# Patient Record
Sex: Female | Born: 1995
Health system: Southern US, Community
[De-identification: ages and names within clinical notes are randomized; demographics above are authoritative.]

## PROBLEM LIST (undated history)

## (undated) DIAGNOSIS — L309 Dermatitis, unspecified: Secondary | ICD-10-CM

## (undated) HISTORY — DX: Dermatitis, unspecified: L30.9

## (undated) HISTORY — PX: NO PAST SURGERIES: SHX2092

---

## 2016-04-14 DIAGNOSIS — J3089 Other allergic rhinitis: Secondary | ICD-10-CM | POA: Insufficient documentation

## 2016-04-14 DIAGNOSIS — J302 Other seasonal allergic rhinitis: Secondary | ICD-10-CM

## 2016-04-14 DIAGNOSIS — H1010 Acute atopic conjunctivitis, unspecified eye: Secondary | ICD-10-CM | POA: Insufficient documentation

## 2016-04-14 HISTORY — DX: Other seasonal allergic rhinitis: J30.2

## 2017-02-01 ENCOUNTER — Ambulatory Visit (INDEPENDENT_AMBULATORY_CARE_PROVIDER_SITE_OTHER): Payer: 59 | Admitting: Family Medicine

## 2017-02-01 ENCOUNTER — Telehealth: Payer: Self-pay | Admitting: Behavioral Health

## 2017-02-01 ENCOUNTER — Encounter: Payer: Self-pay | Admitting: Family Medicine

## 2017-02-01 ENCOUNTER — Encounter: Payer: Self-pay | Admitting: Behavioral Health

## 2017-02-01 VITALS — BP 92/72 | HR 88 | Temp 98.6°F | Ht 63.0 in | Wt 103.6 lb

## 2017-02-01 DIAGNOSIS — R079 Chest pain, unspecified: Secondary | ICD-10-CM

## 2017-02-01 DIAGNOSIS — L2082 Flexural eczema: Secondary | ICD-10-CM | POA: Diagnosis not present

## 2017-02-01 DIAGNOSIS — G8929 Other chronic pain: Secondary | ICD-10-CM

## 2017-02-01 DIAGNOSIS — M25512 Pain in left shoulder: Secondary | ICD-10-CM | POA: Diagnosis not present

## 2017-02-01 HISTORY — DX: Flexural eczema: L20.82

## 2017-02-01 MED ORDER — TRIAMCINOLONE ACETONIDE 0.1 % EX CREA: 1.0000 "application " | TOPICAL_CREAM | Freq: Two times a day (BID) | CUTANEOUS | 3 refills | Status: DC

## 2017-02-01 MED FILL — TRIAMCINOLONE 0.1% CREAM: 0.1 | 10 days supply | Qty: 30 | Fill #0

## 2017-02-01 NOTE — Progress Notes (Signed)
[facility] [address], [address] [place], [place] [address] [phone] [phone]  Date:  02/01/2017   Name:  Jessica Potter   DOB:  June 22, 1996   MRN:  [id number]  PCP:  Pearline Cablesopland, Jessica C, MD    Chief Complaint: Establish Care (Pt here to est care. )   History of Present Illness:  Jessica Potter is a 21 y.o. very pleasant female patient who presents with the following:  Here today as a new patient- she is aging out of pediatrics.   She works at [employer].  She has generally been healthy She enjoys playing baseketball and gets to play about once a week She lives with her mom and GM She is from this area.    She graduated from [employer] Never had any surgery She does not any medication NKDA  She did have some chest pains "a month or two ago," would occur only at night.  Would occur when she was getting ready for bed. Might last 5 minutes, non exertional.   Occurred maybe 3 times. Has not happened in several weeks No issues with exercise- associated symptoms such as CP, SOB or syncope  She is a non smoker Does not drink alcohol She is not sexually active- she has not yet started having sex  She does not have any heart problems as far as we know  [facility],  Montey HoraKathleen Rice.   She does have eczema on her arms- needs some cream to use for this - she does not have anything on hand at this time   She has also noted chronic pain in her left trapezius- this has been present for about 3 years.  It hurts more when she raises her left arm NKI She is right handed   Patient Active Problem List   Diagnosis Date Noted  . Flexural eczema 02/01/2017    Past Medical History:  Diagnosis Date  . Eczema     Past Surgical History:  Procedure Laterality Date  . NO PAST SURGERIES      Social History  Substance Use Topics  . Smoking status: Never Smoker  . Smokeless tobacco: Never Used  . Alcohol use No     Family History  Problem Relation Age of Onset  . Diabetes Maternal Grandmother     No Known Allergies  Medication list has been reviewed and updated.  No current outpatient prescriptions on file prior to visit.   No current facility-administered medications on file prior to visit.     Review of Systems:  As per HPI- otherwise negative.   Physical Examination: Vitals:   02/01/17 1320  BP: 92/72  Pulse: 88  Temp: 98.6 F (37 C)   Vitals:   02/01/17 1320  Weight: 103 lb 9.6 oz (47 kg)  Height: 5\' 3"  (1.6 m)   Body mass index is 18.35 kg/m. Ideal Body Weight: Weight in (lb) to have BMI = 25: 140.8  GEN: WDWN, NAD, Non-toxic, A & O x 3, looks well, petite build, appears younger than age HEENT: Atraumatic, Normocephalic. Neck supple. No masses, No LAD.  Bilateral TM wnl, oropharynx normal.  PEERL,EOMI.   Ears and Nose: No external deformity. CV: RRR, No M/G/R. No JVD. No thrill. No extra heart sounds. PULM: CTA B, no wheezes, crackles, rhonchi. No retractions. No resp. distress. No accessory muscle use. ABD: S, NT, ND. No rebound. No HSM. EXTR: No c/c/e NEURO Normal gait.  PSYCH: Normally interactive. Conversant. Not depressed or anxious appearing.  Calm demeanor.  Upon palpation her superior LEFT trapezius has a palpable area of spasm or fibrous tissue that is tender to palpation.  Normal ROM of the shoulder, normal stregth Mild eczema visible on both arms    Assessment and Plan: Flexural eczema - Plan: triamcinolone cream (KENALOG) 0.1 %  Chronic left shoulder pain - Plan: Ambulatory referral to Sports Medicine  Chest pain, unspecified type  Here today as a new patient to establish care Generally healthy young woman Triamcinolone cream to use as needed for eczema.  Discussed methods to avoid skin dryness Referral to sports med for her shoulder concern Discussed further evaluation of her chest pain- offered EKG, CXR, echo.  For now she prefers to observe  but will seek care if this comes back    Signed Abbe Amsterdam, MD

## 2017-02-01 NOTE — Patient Instructions (Signed)
It was very nice to see you today!  Have a wonderful summer and let me know if your chest pains come back.  I am going to have you see Dr. Pearletha ForgeHudnall (sports medicine) regarding your persistent left shoulder pain We will get your records from [facility] for you

## 2017-02-01 NOTE — Telephone Encounter (Signed)
Pre-Visit Call completed with patient and chart updated.   Pre-Visit Info documented in Specialty Comments under SnapShot.    

## 2017-02-04 ENCOUNTER — Encounter: Payer: Self-pay | Admitting: Family Medicine

## 2017-02-04 ENCOUNTER — Ambulatory Visit (INDEPENDENT_AMBULATORY_CARE_PROVIDER_SITE_OTHER): Payer: 59 | Admitting: Family Medicine

## 2017-02-04 DIAGNOSIS — S46812A Strain of other muscles, fascia and tendons at shoulder and upper arm level, left arm, initial encounter: Secondary | ICD-10-CM | POA: Diagnosis not present

## 2017-02-04 NOTE — Patient Instructions (Signed)
You have a trapezius strain/spasms. Medicine is unlikely to fix this. You can take tylenol 500mg  1-2 tabs three times a day as needed for pain. Aleve 1-2 tabs twice a day with food as needed for pain also. Exercises and stretches are the most important part of treatment. Hold stretches for 20-30 seconds, repeat 3 times. Any strengthening exercises should be done 3 sets of 10 once a day. Call me if you want to do physical therapy. Follow up with me in 6 weeks or as needed.

## 2017-02-09 DIAGNOSIS — S46812A Strain of other muscles, fascia and tendons at shoulder and upper arm level, left arm, initial encounter: Secondary | ICD-10-CM | POA: Insufficient documentation

## 2017-02-09 NOTE — Assessment & Plan Note (Signed)
she declined physical therapy for now - advised this is most likely thing to help with this.  She was given home exercises and stretches to do daily.  Aleve twice a day, tylenol up to 3 times a day as needed for pain.  Call us if she would like to do PT.  F/u in 6 weeks or prn.

## 2017-02-09 NOTE — Progress Notes (Signed)
PCP and consultation requested by: Copland, [name], MD  Subjective:   HPI: Patient is a 21 y.o. female here for left shoulder pain.  Patient reports for about 3 years she's had pain posterior left shoulder. Pain is 0/10 at rest, up to 7-8/10 and sharp at times. No swelling or bruising. repetitive overhead motions bother this area and turning neck. Tried icy hot. Has not done PT or massage. Right handed. No skin changes, numbness. No radiation of pain.  Past Medical History:  Diagnosis Date  . Eczema     Current Outpatient Prescriptions on File Prior to Visit  Medication Sig Dispense Refill  . triamcinolone cream (KENALOG) 0.1 % Apply 1 application topically 2 (two) times daily. Use as needed for eczema 120 g 3   No current facility-administered medications on file prior to visit.     Past Surgical History:  Procedure Laterality Date  . NO PAST SURGERIES      No Known Allergies  Social History   Social History  . Marital status: Single    Spouse name: N/A  . Number of children: N/A  . Years of education: N/A   Occupational History  . Not on file.   Social History Main Topics  . Smoking status: Never Smoker  . Smokeless tobacco: Never Used  . Alcohol use No  . Drug use: No  . Sexual activity: Not on file   Other Topics Concern  . Not on file   Social History Narrative  . No narrative on file    Family History  Problem Relation Age of Onset  . Diabetes Maternal Grandmother     BP 111/73   Pulse 86   Ht 5\' 3"  (1.6 m)   Wt 103 lb (46.7 kg)   LMP 01/19/2017 (LMP Unknown)   BMI 18.25 kg/m   Review of Systems: See HPI above.     Objective:  Physical Exam:  Gen: NAD, comfortable in exam room  Neck: No gross deformity, swelling, bruising. TTP left trapezius.  No midline/bony TTP. FROM neck - pain left lateral rotation. BUE strength 5/5.   Sensation intact to light touch.   2+ equal reflexes in triceps, biceps, brachioradialis  tendons. Negative spurlings. NV intact distal BUEs.  Left shoulder: No swelling, ecchymoses.  No gross deformity. No TTP. FROM. Negative Hawkins, Neers. Negative Yergasons. Strength 5/5 with empty can and resisted internal/external rotation. Negative apprehension. NV intact distally.   Assessment & Plan:  1. Left trapezius strain/spasms - she declined physical therapy for now - advised this is most likely thing to help with this.  She was given home exercises and stretches to do daily.  Aleve twice a day, tylenol up to 3 times a day as needed for pain.  Call us if she would like to do PT.  F/u in 6 weeks or prn.

## 2017-02-23 ENCOUNTER — Telehealth: Payer: Self-pay | Admitting: *Deleted

## 2017-02-23 NOTE — Telephone Encounter (Signed)
Received Medical records from [facility], Montey HoraKathleen Rice, MD, forwarded to provider/SLS 06/12

## 2017-02-24 ENCOUNTER — Telehealth: Payer: Self-pay | Admitting: Family Medicine

## 2017-02-24 NOTE — Telephone Encounter (Signed)
[facility], Dr. Montey HoraKathleen Rice.  Will abstract/ scan for her

## 2017-03-01 MED FILL — TRIAMCINOLONE 0.1% CREAM: 0.1 | 10 days supply | Qty: 30 | Fill #1

## 2017-03-15 MED FILL — TRIAMCINOLONE 0.1% CREAM: 0.1 | 10 days supply | Qty: 30 | Fill #2

## 2017-07-08 MED FILL — TRIAMCINOLONE 0.1% CREAM: 0.1 | 10 days supply | Qty: 30 | Fill #3

## 2017-09-17 MED FILL — TRIAMCINOLONE 0.1% CREAM: 0.1 | 10 days supply | Qty: 30 | Fill #4

## 2017-10-06 NOTE — Progress Notes (Signed)
[facility] [address], [address] [place], [place] [address] [phone] [phone]  Date:  10/07/2017   Name:  Jessica HopeBreanna Panther   DOB:  [birth year]   MRN:  [id number]  PCP:  Pearline Cablesopland, Jessica C, MD    Chief Complaint: Headache (c/o headaches x 1-2 weeks)   History of Present Illness:  Jessica Potter is a 22 y.o. very pleasant female patient who presents with the following:  Generally healthy young woman here today with concern of headaches and nosebleeds for 3 days- she states NOT  1-2 weeks as originally documented The headches have now resolved. The nosebleeds have also stopped She feels "good" now  She was sick with a possible sinus infection prior to onset of these sx with headache, nasal congestion, cough She notes brief, easy to control nosebleeds from mostly the left side  She also notes spots on her tongue when she eats pineapple or kiwi fruit.  Otherwise this does not happen Her ?grandmother also prompts her to mention that her "back always feels like it needs to be cracked," and that "she is always cracking all her joints."    Patient Active Problem List   Diagnosis Date Noted  . Trapezius muscle strain, left, initial encounter 02/09/2017  . Flexural eczema 02/01/2017  . Seasonal allergic rhinitis 04/14/2016    Past Medical History:  Diagnosis Date  . Eczema     Past Surgical History:  Procedure Laterality Date  . NO PAST SURGERIES      Social History   Tobacco Use  . Smoking status: Never Smoker  . Smokeless tobacco: Never Used  Substance Use Topics  . Alcohol use: No  . Drug use: No    Family History  Problem Relation Age of Onset  . Diabetes Maternal Grandmother     No Known Allergies  Medication list has been reviewed and updated.  Current Outpatient Medications on File Prior to Visit  Medication Sig Dispense Refill  . triamcinolone cream (KENALOG) 0.1 % Apply 1 application topically 2 (two) times  daily. Use as needed for eczema 120 g 3   No current facility-administered medications on file prior to visit.     Review of Systems:  As per HPI- otherwise negative. No fever, chills, rash, ST or cough Pt is currently feeling fine   Pulse Readings from Last 3 Encounters:  10/07/17 (!) 101  02/04/17 86  02/01/17 88  '   Physical Examination: Vitals:   10/07/17 1810  BP: 106/70  Pulse: (!) 101  Temp: 98.7 F (37.1 C)  SpO2: 99%   Vitals:   10/07/17 1810  Weight: 107 lb 9.6 oz (48.8 kg)  Height: 5\' 3"  (1.6 m)   Body mass index is 19.06 kg/m. Ideal Body Weight: Weight in (lb) to have BMI = 25: 140.8  GEN: WDWN, NAD, Non-toxic, A & O x 3, appears well, slim build HEENT: Atraumatic, Normocephalic. Neck supple. No masses, No LAD.  Bilateral TM wnl, oropharynx normal.  PEERL,EOMI.  No evidence of bleeding from either nare Ears and Nose: No external deformity. CV: RRR, No M/G/R. No JVD. No thrill. No extra heart sounds. PULM: CTA B, no wheezes, crackles, rhonchi. No retractions. No resp. distress. No accessory muscle use. ABD: S, NT, ND. No rebound. No HSM. EXTR: No c/c/e NEURO Normal gait.  PSYCH: Normally interactive. Conversant. Not depressed or anxious appearing.  Calm  demeanor.  Minimal thoracic scoliosis on exam,  Normal spine ROM  Assessment and Plan: Frequent nosebleeds  Frequent headaches  Tongue coating  Thoracic back pain, unspecified back pain laterality, unspecified chronicity  Here today with several concerns.   She notes that she was recently ill, and then had nosebleeds and headaches for a few days. These sx are now totally resolved and she feels fine.  Asked her to let me know if these sx return She also notes a tongue reaction when she eats pineapple or kiwi- advised that this is likely a low level allergy, related to latex allergy,  If this gets worse she should avoid these fruits Pt states that she often feels like she needs to crack her  back.  Reassured that her exam is benign and this is likely just a personal trait    Signed Abbe Amsterdam, MD

## 2017-10-07 ENCOUNTER — Ambulatory Visit (INDEPENDENT_AMBULATORY_CARE_PROVIDER_SITE_OTHER): Payer: Self-pay | Admitting: Family Medicine

## 2017-10-07 ENCOUNTER — Encounter: Payer: Self-pay | Admitting: Family Medicine

## 2017-10-07 VITALS — BP 106/70 | HR 101 | Temp 98.7°F | Ht 63.0 in | Wt 107.6 lb

## 2017-10-07 DIAGNOSIS — M546 Pain in thoracic spine: Secondary | ICD-10-CM

## 2017-10-07 DIAGNOSIS — R040 Epistaxis: Secondary | ICD-10-CM

## 2017-10-07 DIAGNOSIS — R51 Headache: Secondary | ICD-10-CM

## 2017-10-07 DIAGNOSIS — R519 Headache, unspecified: Secondary | ICD-10-CM

## 2017-10-07 DIAGNOSIS — K143 Hypertrophy of tongue papillae: Secondary | ICD-10-CM

## 2017-10-07 NOTE — Patient Instructions (Signed)
Take care, let me know if you have any return of your nosebleeds or headaches

## 2017-12-14 NOTE — Progress Notes (Addendum)
[facility] [address] [place], [place] [address] [phone]  Date:  12/15/2017   Name:  Jessica Potter   DOB:  1995/12/13   MRN:  [id number]  PCP:  Jessica Cables, MD    Chief Complaint: URI (cough, sore throat, headache, 1 week-no fever) and Conjunctivitis   History of Present Illness:  Jessica Potter is a 21 y.o. very pleasant female patient who presents with the following:  Generally healthy young woman here today with concern of illness for about one week Her throat has been sore, feels scratchy She notes discharge from her left eye only- right eye is ok No fever She has been coughing some but this is not a main feature She did have aches for about 2 days, now better No GI symptoms Her eye can be crusty in the am She does not wear any corrective lenses Her vision seems to be ok  They eye is itchy but not painful No sick contacts at home  She has tried some advil OTC  Patient Active Problem List   Diagnosis Date Noted  . Trapezius muscle strain, left, initial encounter 02/09/2017  . Flexural eczema 02/01/2017  . Seasonal allergic rhinitis 04/14/2016    Past Medical History:  Diagnosis Date  . Eczema     Past Surgical History:  Procedure Laterality Date  . NO PAST SURGERIES      Social History   Tobacco Use  . Smoking status: Never Smoker  . Smokeless tobacco: Never Used  Substance Use Topics  . Alcohol use: No  . Drug use: No    Family History  Problem Relation Age of Onset  . Diabetes Maternal Grandmother     No Known Allergies  Medication list has been reviewed and updated.  No current outpatient medications on file prior to visit.   No current facility-administered medications on file prior to visit.     Review of Systems:  As per HPI- otherwise negative.   Physical Examination: Vitals:   12/15/17 1511  BP: 108/72  Pulse: (!) 134  Resp: 16  Temp: 98.1 F  (36.7 C)  SpO2: 100%   Vitals:   12/15/17 1511  Weight: 111 lb (50.3 kg)  Height: 5\' 3"  (1.6 m)   Body mass index is 19.66 kg/m. Ideal Body Weight: Weight in (lb) to have BMI = 25: 140.8  GEN: WDWN, NAD, Non-toxic, A & O x 3. Slight build, looks well except for injection of left eye  HEENT: Atraumatic, Normocephalic. Neck supple. No masses, No LAD. Bilateral TM wnl, oropharynx normal.  PEERL,EOMI.   Left conjunctivae is injected c/w conjunctivitis Limited fundoscopic exam wnl Ears and Nose: No external deformity. CV: RRR, No M/G/R. No JVD. No thrill. No extra heart sounds. PULM: CTA B, no wheezes, crackles, rhonchi. No retractions. No resp. distress. No accessory muscle use. ABD: S, NT, ND. No rebound. No HSM. EXTR: No c/c/e NEURO Normal gait.  PSYCH: Normally interactive. Conversant. Not depressed or anxious appearing.  Calm demeanor.   Results for orders placed or performed in visit on 12/15/17  POCT rapid strep A  Result Value Ref Range   Rapid Strep A Screen Negative Negative     Assessment and Plan: Sore throat - Plan: POCT rapid strep A, Culture, Group A Strep  Acute conjunctivitis of left eye, unspecified acute conjunctivitis type - Plan: trimethoprim-polymyxin b (POLYTRIM) ophthalmic solution  Rapid strep negative- will send culture but  suspect this may be adenovirus.  Will cover for conjunctivitis with polytrim.  Await culture and will contact her.   Discussed supportive care Note for her job   Signed Abbe AmsterdamJessica Bulmaro Feagans, MD  Pulse Readings from Last 3 Encounters:  12/15/17 (!) 134  10/07/17 (!) 101  02/04/17 86    BP Readings from Last 3 Encounters:  12/15/17 108/72  10/07/17 106/70  02/04/17 111/73    Received throat culture 4/5- also negative  Results for orders placed or performed in visit on 12/15/17  Culture, Group A Strep  Result Value Ref Range   MICRO NUMBER: [id number]    SPECIMEN QUALITY: ADEQUATE    SOURCE: THROAT    STATUS: FINAL     RESULT: No group A Streptococcus isolated   POCT rapid strep A  Result Value Ref Range   Rapid Strep A Screen Negative Negative   Message to pt

## 2017-12-15 ENCOUNTER — Encounter: Payer: Self-pay | Admitting: Family Medicine

## 2017-12-15 ENCOUNTER — Ambulatory Visit: Payer: BLUE CROSS/BLUE SHIELD | Admitting: Family Medicine

## 2017-12-15 VITALS — BP 108/72 | HR 102 | Temp 98.1°F | Resp 16 | Ht 63.0 in | Wt 111.0 lb

## 2017-12-15 DIAGNOSIS — H1032 Unspecified acute conjunctivitis, left eye: Secondary | ICD-10-CM

## 2017-12-15 DIAGNOSIS — J029 Acute pharyngitis, unspecified: Secondary | ICD-10-CM | POA: Diagnosis not present

## 2017-12-15 LAB — POCT RAPID STREP A (OFFICE): Rapid Strep A Screen: NEGATIVE

## 2017-12-15 MED ORDER — POLYMYXIN B-TRIMETHOPRIM 10000-0.1 UNIT/ML-% OP SOLN: 1.0000 [drp] | OPHTHALMIC | 0 refills | Status: DC

## 2017-12-15 MED FILL — POLYMYXIN B/TMP EYE DROPS: 10000-0.1 | 42 days supply | Qty: 10 | Fill #0

## 2017-12-15 NOTE — Patient Instructions (Addendum)
Good to see you today- I am sorry that you are not feeling well! Your rapid strep was negative, but we will send off a culture to make sure that you do not have strep. I suspect you may have a viral infection called adenovirus. However, we will use an eye medication for you in case you have a bacterial infection of your eye   Rest, fluids, tylenol and/or advil can be helpful  Please let me know if you are not much better in the next 2-3 days- Sooner if worse.

## 2017-12-17 ENCOUNTER — Encounter: Payer: Self-pay | Admitting: Family Medicine

## 2017-12-17 LAB — CULTURE, GROUP A STREP
MICRO NUMBER: 90412049
SPECIMEN QUALITY:: ADEQUATE

## 2018-01-04 ENCOUNTER — Other Ambulatory Visit: Payer: Self-pay | Admitting: Family Medicine

## 2018-01-04 DIAGNOSIS — H1032 Unspecified acute conjunctivitis, left eye: Secondary | ICD-10-CM

## 2018-01-05 ENCOUNTER — Encounter: Payer: Self-pay | Admitting: Family Medicine

## 2018-01-05 MED FILL — TRIAMCINOLONE 0.1% CREAM: 0.1 | 10 days supply | Qty: 30 | Fill #5

## 2018-01-05 NOTE — Telephone Encounter (Signed)
Received refill request for Polytrim eye drops. Please advise.

## 2018-09-14 DIAGNOSIS — R87629 Unspecified abnormal cytological findings in specimens from vagina: Secondary | ICD-10-CM | POA: Insufficient documentation

## 2018-09-14 HISTORY — DX: Unspecified abnormal cytological findings in specimens from vagina: R87.629

## 2018-11-05 NOTE — Progress Notes (Addendum)
[facility] [address], [address] [place], [place] [address] [phone]  Date:  11/10/2018   Name:  Jessica Potter   DOB:  05/02/1996   MRN:  [id number]  PCP:  Pearline Cables, MD    Chief Complaint: Annual Exam (declines flu shot)   History of Present Illness:  Jessica Potter is a 23 y.o. very pleasant female patient who presents with the following:  Generally healthy young woman here today for physical She is originally from this area Working at at [employer] and [employer]  She is doing a lot of hours In her free time she enjoys resting  Never a smoker  Her LMP ended 2 days ago Last had intercourse last week - prior to her menses She is interested in contraception Discussed options with her- pill, patch, ring, depo, iud She would like to use depo after discussion of options No history of DVT/ pe/ stroke/ cancer  Pap:do today, will be her first  Labs:will do for her today .  She declines HIV Immunizations: Question flu shot- she declines   Patient Active Problem List   Diagnosis Date Noted  . Trapezius muscle strain, left, initial encounter 02/09/2017  . Flexural eczema 02/01/2017  . Seasonal allergic rhinitis 04/14/2016    Past Medical History:  Diagnosis Date  . Eczema     Past Surgical History:  Procedure Laterality Date  . NO PAST SURGERIES      Social History   Tobacco Use  . Smoking status: Never Smoker  . Smokeless tobacco: Never Used  Substance Use Topics  . Alcohol use: No  . Drug use: No    Family History  Problem Relation Age of Onset  . Diabetes Maternal Grandmother     No Known Allergies  Medication list has been reviewed and updated.  No current outpatient medications on file prior to visit.   No current facility-administered medications on file prior to visit.     Review of Systems:  As per HPI- otherwise negative. No fever or chills No CP or SOB  No breast  changes or concerns  Physical Examination: Vitals:   11/10/18 1501 11/10/18 1557  BP: 122/80   Pulse: (!) 112 85  Resp: 16   Temp: 98.4 F (36.9 C)   SpO2: 100%    Vitals:   11/10/18 1501  Weight: 113 lb (51.3 kg)  Height: 5\' 3"  (1.6 m)   Body mass index is 20.02 kg/m. Ideal Body Weight: Weight in (lb) to have BMI = 25: 140.8  GEN: WDWN, NAD, Non-toxic, A & O x 3, petite build, looks well  HEENT: Atraumatic, Normocephalic. Neck supple. No masses, No LAD. Ears and Nose: No external deformity. CV: RRR, No M/G/R. No JVD. No thrill. No extra heart sounds. PULM: CTA B, no wheezes, crackles, rhonchi. No retractions. No resp. distress. No accessory muscle use. ABD: S, NT, ND. No rebound. No HSM. EXTR: No c/c/e NEURO Normal gait.  PSYCH: Normally interactive. Conversant. Not depressed or anxious appearing.  Calm demeanor.  Breast: normal exam, no masses/ dimpling/ discharge Pelvic: normal, no vaginal lesions or discharge. Uterus normal, no CMT, no adnexal tendereness or masses  Waist 25in Assessment and Plan: Physical exam  Contraceptive use education - Plan: POCT urine pregnancy, medroxyPROGESTERone (DEPO-PROVERA) injection 150 mg, DISCONTINUED: medroxyPROGESTERone (DEPO-PROVERA) injection 150 mg  Screening for cervical cancer - Plan: Cytology - PAP  Screening for diabetes mellitus - Plan: Comprehensive metabolic panel,  Hemoglobin A1c  Screening for deficiency anemia - Plan: CBC  Screening for hyperlipidemia - Plan: Lipid panel  Flexural eczema - Plan: triamcinolone cream (KENALOG) 0.1 %  Hyperkalemia - Plan: Potassium  Here today for a CPE Start on depo shot today- went over perpetual calendar and how to use this medication Advised her to use condoms for the first 2 weeks Labs pending as above-  Pt has a form for her insurance that will need to be filled out once her labs come in   Signed Abbe Amsterdam, MD  Results for orders placed or performed in visit on  11/10/18  CBC  Result Value Ref Range   WBC 7.1 4.0 - 10.5 K/uL   RBC 4.61 3.87 - 5.11 Mil/uL   Platelets 293.0 150.0 - 400.0 K/uL   Hemoglobin 13.7 12.0 - 15.0 g/dL   HCT 16.1 09.6 - 04.5 %   MCV 92.1 78.0 - 100.0 fl   MCHC 32.3 30.0 - 36.0 g/dL   RDW 40.9 81.1 - 91.4 %  Comprehensive metabolic panel  Result Value Ref Range   Sodium 140 135 - 145 mEq/L   Potassium 5.3 (H) 3.5 - 5.1 mEq/L   Chloride 105 96 - 112 mEq/L   CO2 27 19 - 32 mEq/L   Glucose, Bld 88 70 - 99 mg/dL   BUN 15 6 - 23 mg/dL   Creatinine, Ser 7.82 0.40 - 1.20 mg/dL   Total Bilirubin 0.3 0.2 - 1.2 mg/dL   Alkaline Phosphatase 68 39 - 117 U/L   AST 18 0 - 37 U/L   ALT 13 0 - 35 U/L   Total Protein 7.7 6.0 - 8.3 g/dL   Albumin 4.7 3.5 - 5.2 g/dL   Calcium 95.6 8.4 - 21.3 mg/dL   GFR 086.57 >84.69 mL/min  Lipid panel  Result Value Ref Range   Cholesterol 167 0 - 200 mg/dL   Triglycerides 62.9 0.0 - 149.0 mg/dL   HDL 52.84 >13.24 mg/dL   VLDL 7.6 0.0 - 40.1 mg/dL   LDL Cholesterol 82 0 - 99 mg/dL   Total CHOL/HDL Ratio 2    NonHDL 89.51   Hemoglobin A1c  Result Value Ref Range   Hgb A1c MFr Bld 5.4 4.6 - 6.5 %  POCT urine pregnancy  Result Value Ref Range   Preg Test, Ur Negative Negative  Cytology - PAP  Result Value Ref Range   Adequacy (A)     Satisfactory for evaluation  endocervical/transformation zone component PRESENT.   Diagnosis (A)     LOW GRADE SQUAMOUS INTRAEPITHELIAL LESION: CIN-1/ HPV (LSIL).   Chlamydia Negative    Neisseria gonorrhea Negative    Material Submitted CervicoVaginal Pap [ThinPrep Imaged] (A)    Received her labs 2/28-sent message to patient Also completed her insurance form, which we will fax on Monday  Results for orders placed or performed in visit on 11/10/18  CBC  Result Value Ref Range   WBC 7.1 4.0 - 10.5 K/uL   RBC 4.61 3.87 - 5.11 Mil/uL   Platelets 293.0 150.0 - 400.0 K/uL   Hemoglobin 13.7 12.0 - 15.0 g/dL   HCT 02.7 25.3 - 66.4 %   MCV 92.1 78.0 -  100.0 fl   MCHC 32.3 30.0 - 36.0 g/dL   RDW 40.3 47.4 - 25.9 %  Comprehensive metabolic panel  Result Value Ref Range   Sodium 140 135 - 145 mEq/L   Potassium 5.3 (H) 3.5 - 5.1 mEq/L   Chloride 105 96 -  112 mEq/L   CO2 27 19 - 32 mEq/L   Glucose, Bld 88 70 - 99 mg/dL   BUN 15 6 - 23 mg/dL   Creatinine, Ser 4.94 0.40 - 1.20 mg/dL   Total Bilirubin 0.3 0.2 - 1.2 mg/dL   Alkaline Phosphatase 68 39 - 117 U/L   AST 18 0 - 37 U/L   ALT 13 0 - 35 U/L   Total Protein 7.7 6.0 - 8.3 g/dL   Albumin 4.7 3.5 - 5.2 g/dL   Calcium 49.6 8.4 - 75.9 mg/dL   GFR 163.84 >66.59 mL/min  Lipid panel  Result Value Ref Range   Cholesterol 167 0 - 200 mg/dL   Triglycerides 93.5 0.0 - 149.0 mg/dL   HDL 70.17 >79.39 mg/dL   VLDL 7.6 0.0 - 03.0 mg/dL   LDL Cholesterol 82 0 - 99 mg/dL   Total CHOL/HDL Ratio 2    NonHDL 89.51   Hemoglobin A1c  Result Value Ref Range   Hgb A1c MFr Bld 5.4 4.6 - 6.5 %  POCT urine pregnancy  Result Value Ref Range   Preg Test, Ur Negative Negative  Cytology - PAP  Result Value Ref Range   Adequacy (A)     Satisfactory for evaluation  endocervical/transformation zone component PRESENT.   Diagnosis (A)     LOW GRADE SQUAMOUS INTRAEPITHELIAL LESION: CIN-1/ HPV (LSIL).   Chlamydia Negative    Neisseria gonorrhea Negative    Material Submitted CervicoVaginal Pap [ThinPrep Imaged] (A)    Message to pt   Your blood counts are normal Metabolic profile is normal, except for slightly increased potassium.  This is likely due to blood processing. This very mild elevation is unlikely to be dangerous, but I would like to make sure it returns to normal.  I have ordered a repeat potassium level.  You can have this done as a lab visit only at your convenience Your cholesterol looks terrific  A1c shows no sign of diabetes  Take care, please plan for a physical in 1 year  Addendum 3/4, received her Pap She has LSIL Per [facility], the preferred management of a woman her age with  LSIL is a repeat cytology in 1 year However as her report also mentions CIN-1, I am not certain if she may need colposcopy.  Will refer to gynecology Message to patient with this information

## 2018-11-10 ENCOUNTER — Ambulatory Visit (INDEPENDENT_AMBULATORY_CARE_PROVIDER_SITE_OTHER): Payer: 59 | Admitting: Family Medicine

## 2018-11-10 ENCOUNTER — Encounter: Payer: Self-pay | Admitting: Family Medicine

## 2018-11-10 ENCOUNTER — Other Ambulatory Visit (HOSPITAL_COMMUNITY)
Admission: RE | Admit: 2018-11-10 | Discharge: 2018-11-10 | Disposition: A | Discharge status: Home / Self Care | Payer: 59 | Source: Ambulatory Visit | Attending: Family Medicine | Admitting: Family Medicine

## 2018-11-10 VITALS — BP 122/80 | HR 85 | Temp 98.4°F | Resp 16 | Ht 63.0 in | Wt 113.0 lb

## 2018-11-10 DIAGNOSIS — Z131 Encounter for screening for diabetes mellitus: Secondary | ICD-10-CM | POA: Diagnosis not present

## 2018-11-10 DIAGNOSIS — Z130 Encounter for screening for diseases of the blood and blood-forming organs and certain disorders involving the immune mechanism: Secondary | ICD-10-CM | POA: Diagnosis not present

## 2018-11-10 DIAGNOSIS — Z0000 Encounter for general adult medical examination without abnormal findings: Secondary | ICD-10-CM

## 2018-11-10 DIAGNOSIS — Z3009 Encounter for other general counseling and advice on contraception: Secondary | ICD-10-CM

## 2018-11-10 DIAGNOSIS — Z124 Encounter for screening for malignant neoplasm of cervix: Secondary | ICD-10-CM | POA: Diagnosis not present

## 2018-11-10 DIAGNOSIS — E875 Hyperkalemia: Secondary | ICD-10-CM

## 2018-11-10 DIAGNOSIS — Z13220 Encounter for screening for lipoid disorders: Secondary | ICD-10-CM

## 2018-11-10 DIAGNOSIS — L2082 Flexural eczema: Secondary | ICD-10-CM

## 2018-11-10 DIAGNOSIS — R87612 Low grade squamous intraepithelial lesion on cytologic smear of cervix (LGSIL): Secondary | ICD-10-CM

## 2018-11-10 LAB — POCT URINE PREGNANCY: PREG TEST UR: NEGATIVE

## 2018-11-10 MED ORDER — MEDROXYPROGESTERONE ACETATE 150 MG/ML IM SUSP
150.0000 mg | Freq: Once | INTRAMUSCULAR | Status: AC
  Administered 2018-11-10: 150 mg via INTRAMUSCULAR

## 2018-11-10 MED ORDER — MEDROXYPROGESTERONE ACETATE 150 MG/ML IM SUSP: 150.0000 mg | Freq: Once | INTRAMUSCULAR | Status: DC

## 2018-11-10 MED ORDER — TRIAMCINOLONE ACETONIDE 0.1 % EX CREA: 1.0000 "application " | TOPICAL_CREAM | Freq: Two times a day (BID) | CUTANEOUS | 3 refills | Status: DC

## 2018-11-10 MED FILL — TRIAMCINOLONE ACETONIDE 0.1: 0.1 | 20 days supply | Qty: 80 | Fill #0

## 2018-11-10 NOTE — Patient Instructions (Addendum)
It was great to see you today- I will be in touch with your labs and pap asap  You got your first depo shot today; you get this shot every 3 months You can print off a "depo perpetual calendar" on your home computer to remind yourself  I will fill out and fax in your insurance form once your labs come in   Health Maintenance, Female Adopting a healthy lifestyle and getting preventive care can go a long way to promote health and wellness. Talk with your health care provider about what schedule of regular examinations is right for you. This is a good chance for you to check in with your provider about disease prevention and staying healthy. In between checkups, there are plenty of things you can do on your own. Experts have done a lot of research about which lifestyle changes and preventive measures are most likely to keep you healthy. Ask your health care provider for more information. Weight and diet Eat a healthy diet  Be sure to include plenty of vegetables, fruits, low-fat dairy products, and lean protein.  Do not eat a lot of foods high in solid fats, added sugars, or salt.  Get regular exercise. This is one of the most important things you can do for your health. ? Most adults should exercise for at least 150 minutes each week. The exercise should increase your heart rate and make you sweat (moderate-intensity exercise). ? Most adults should also do strengthening exercises at least twice a week. This is in addition to the moderate-intensity exercise. Maintain a healthy weight  Body mass index (BMI) is a measurement that can be used to identify possible weight problems. It estimates body fat based on height and weight. Your health care provider can help determine your BMI and help you achieve or maintain a healthy weight.  For females 50 years of age and older: ? A BMI below 18.5 is considered underweight. ? A BMI of 18.5 to 24.9 is normal. ? A BMI of 25 to 29.9 is considered  overweight. ? A BMI of 30 and above is considered obese. Watch levels of cholesterol and blood lipids  You should start having your blood tested for lipids and cholesterol at 22 years of age, then have this test every 5 years.  You may need to have your cholesterol levels checked more often if: ? Your lipid or cholesterol levels are high. ? You are older than 22 years of age. ? You are at high risk for heart disease. Cancer screening Lung Cancer  Lung cancer screening is recommended for adults 48-36 years old who are at high risk for lung cancer because of a history of smoking.  A yearly low-dose CT scan of the lungs is recommended for people who: ? Currently smoke. ? Have quit within the past 15 years. ? Have at least a 30-pack-year history of smoking. A pack year is smoking an average of one pack of cigarettes a day for 1 year.  Yearly screening should continue until it has been 15 years since you quit.  Yearly screening should stop if you develop a health problem that would prevent you from having lung cancer treatment. Breast Cancer  Practice breast self-awareness. This means understanding how your breasts normally appear and feel.  It also means doing regular breast self-exams. Let your health care provider know about any changes, no matter how small.  If you are in your 20s or 30s, you should have a clinical breast exam (CBE)  by a health care provider every 1-3 years as part of a regular health exam.  If you are 86 or older, have a CBE every year. Also consider having a breast X-ray (mammogram) every year.  If you have a family history of breast cancer, talk to your health care provider about genetic screening.  If you are at high risk for breast cancer, talk to your health care provider about having an MRI and a mammogram every year.  Breast cancer gene (BRCA) assessment is recommended for women who have family members with BRCA-related cancers. BRCA-related cancers  include: ? Breast. ? Ovarian. ? Tubal. ? Peritoneal cancers.  Results of the assessment will determine the need for genetic counseling and BRCA1 and BRCA2 testing. Cervical Cancer Your health care provider may recommend that you be screened regularly for cancer of the pelvic organs (ovaries, uterus, and vagina). This screening involves a pelvic examination, including checking for microscopic changes to the surface of your cervix (Pap test). You may be encouraged to have this screening done every 3 years, beginning at age 20.  For women ages 35-65, health care providers may recommend pelvic exams and Pap testing every 3 years, or they may recommend the Pap and pelvic exam, combined with testing for human papilloma virus (HPV), every 5 years. Some types of HPV increase your risk of cervical cancer. Testing for HPV may also be done on women of any age with unclear Pap test results.  Other health care providers may not recommend any screening for nonpregnant women who are considered low risk for pelvic cancer and who do not have symptoms. Ask your health care provider if a screening pelvic exam is right for you.  If you have had past treatment for cervical cancer or a condition that could lead to cancer, you need Pap tests and screening for cancer for at least 20 years after your treatment. If Pap tests have been discontinued, your risk factors (such as having a new sexual partner) need to be reassessed to determine if screening should resume. Some women have medical problems that increase the chance of getting cervical cancer. In these cases, your health care provider may recommend more frequent screening and Pap tests. Colorectal Cancer  This type of cancer can be detected and often prevented.  Routine colorectal cancer screening usually begins at 22 years of age and continues through 22 years of age.  Your health care provider may recommend screening at an earlier age if you have risk factors for  colon cancer.  Your health care provider may also recommend using home test kits to check for hidden blood in the stool.  A small camera at the end of a tube can be used to examine your colon directly (sigmoidoscopy or colonoscopy). This is done to check for the earliest forms of colorectal cancer.  Routine screening usually begins at age 59.  Direct examination of the colon should be repeated every 5-10 years through 22 years of age. However, you may need to be screened more often if early forms of precancerous polyps or small growths are found. Skin Cancer  Check your skin from head to toe regularly.  Tell your health care provider about any new moles or changes in moles, especially if there is a change in a mole's shape or color.  Also tell your health care provider if you have a mole that is larger than the size of a pencil eraser.  Always use sunscreen. Apply sunscreen liberally and repeatedly throughout the  day.  Protect yourself by wearing long sleeves, pants, a wide-brimmed hat, and sunglasses whenever you are outside. Heart disease, diabetes, and high blood pressure  High blood pressure causes heart disease and increases the risk of stroke. High blood pressure is more likely to develop in: ? People who have blood pressure in the high end of the normal range (130-139/85-89 mm Hg). ? People who are overweight or obese. ? People who are African American.  If you are 47-67 years of age, have your blood pressure checked every 3-5 years. If you are 83 years of age or older, have your blood pressure checked every year. You should have your blood pressure measured twice-once when you are at a hospital or clinic, and once when you are not at a hospital or clinic. Record the average of the two measurements. To check your blood pressure when you are not at a hospital or clinic, you can use: ? An automated blood pressure machine at a pharmacy. ? A home blood pressure monitor.  If you are  between 23 years and 68 years old, ask your health care provider if you should take aspirin to prevent strokes.  Have regular diabetes screenings. This involves taking a blood sample to check your fasting blood sugar level. ? If you are at a normal weight and have a low risk for diabetes, have this test once every three years after 22 years of age. ? If you are overweight and have a high risk for diabetes, consider being tested at a younger age or more often. Preventing infection Hepatitis B  If you have a higher risk for hepatitis B, you should be screened for this virus. You are considered at high risk for hepatitis B if: ? You were born in a country where hepatitis B is common. Ask your health care provider which countries are considered high risk. ? Your parents were born in a high-risk country, and you have not been immunized against hepatitis B (hepatitis B vaccine). ? You have HIV or AIDS. ? You use needles to inject street drugs. ? You live with someone who has hepatitis B. ? You have had sex with someone who has hepatitis B. ? You get hemodialysis treatment. ? You take certain medicines for conditions, including cancer, organ transplantation, and autoimmune conditions. Hepatitis C  Blood testing is recommended for: ? Everyone born from [birth year] through 1964. ? Anyone with known risk factors for hepatitis C. Sexually transmitted infections (STIs)  You should be screened for sexually transmitted infections (STIs) including gonorrhea and chlamydia if: ? You are sexually active and are younger than 22 years of age. ? You are older than 22 years of age and your health care provider tells you that you are at risk for this type of infection. ? Your sexual activity has changed since you were last screened and you are at an increased risk for chlamydia or gonorrhea. Ask your health care provider if you are at risk.  If you do not have HIV, but are at risk, it may be recommended that you take  a prescription medicine daily to prevent HIV infection. This is called pre-exposure prophylaxis (PrEP). You are considered at risk if: ? You are sexually active and do not regularly use condoms or know the HIV status of your partner(s). ? You take drugs by injection. ? You are sexually active with a partner who has HIV. Talk with your health care provider about whether you are at high risk of being infected  with HIV. If you choose to begin PrEP, you should first be tested for HIV. You should then be tested every 3 months for as long as you are taking PrEP. Pregnancy  If you are premenopausal and you may become pregnant, ask your health care provider about preconception counseling.  If you may become pregnant, take 400 to 800 micrograms (mcg) of folic acid every day.  If you want to prevent pregnancy, talk to your health care provider about birth control (contraception). Osteoporosis and menopause  Osteoporosis is a disease in which the bones lose minerals and strength with aging. This can result in serious bone fractures. Your risk for osteoporosis can be identified using a bone density scan.  If you are 86 years of age or older, or if you are at risk for osteoporosis and fractures, ask your health care provider if you should be screened.  Ask your health care provider whether you should take a calcium or vitamin D supplement to lower your risk for osteoporosis.  Menopause may have certain physical symptoms and risks.  Hormone replacement therapy may reduce some of these symptoms and risks. Talk to your health care provider about whether hormone replacement therapy is right for you. Follow these instructions at home:  Schedule regular health, dental, and eye exams.  Stay current with your immunizations.  Do not use any tobacco products including cigarettes, chewing tobacco, or electronic cigarettes.  If you are pregnant, do not drink alcohol.  If you are breastfeeding, limit how  much and how often you drink alcohol.  Limit alcohol intake to no more than 1 drink per day for nonpregnant women. One drink equals 12 ounces of beer, 5 ounces of wine, or 1 ounces of hard liquor.  Do not use street drugs.  Do not share needles.  Ask your health care provider for help if you need support or information about quitting drugs.  Tell your health care provider if you often feel depressed.  Tell your health care provider if you have ever been abused or do not feel safe at home. This information is not intended to replace advice given to you by your health care provider. Make sure you discuss any questions you have with your health care provider. Document Released: 03/16/2011 Document Revised: 02/06/2016 Document Reviewed: 06/04/2015 Elsevier Interactive Patient Education  2019 [employer].

## 2018-11-11 ENCOUNTER — Encounter: Payer: Self-pay | Admitting: Family Medicine

## 2018-11-11 LAB — COMPREHENSIVE METABOLIC PANEL
ALK PHOS: 68 U/L (ref 39–117)
ALT: 13 U/L (ref 0–35)
AST: 18 U/L (ref 0–37)
Albumin: 4.7 g/dL (ref 3.5–5.2)
BILIRUBIN TOTAL: 0.3 mg/dL (ref 0.2–1.2)
BUN: 15 mg/dL (ref 6–23)
CALCIUM: 10 mg/dL (ref 8.4–10.5)
CO2: 27 mEq/L (ref 19–32)
Chloride: 105 mEq/L (ref 96–112)
Creatinine, Ser: 0.85 mg/dL (ref 0.40–1.20)
GFR: 102 mL/min (ref >60.00)
Glucose, Bld: 88 mg/dL (ref 70–99)
Potassium: 5.3 mEq/L — ABNORMAL HIGH (ref 3.5–5.1)
Sodium: 140 mEq/L (ref 135–145)
TOTAL PROTEIN: 7.7 g/dL (ref 6.0–8.3)

## 2018-11-11 LAB — CBC
HEMATOCRIT: 42.4 % (ref 36.0–46.0)
HEMOGLOBIN: 13.7 g/dL (ref 12.0–15.0)
MCHC: 32.3 g/dL (ref 30.0–36.0)
MCV: 92.1 fl (ref 78.0–100.0)
PLATELETS: 293 10*3/uL (ref 150.0–400.0)
RBC: 4.61 Mil/uL (ref 3.87–5.11)
RDW: 14.4 % (ref 11.5–15.5)
WBC: 7.1 10*3/uL (ref 4.0–10.5)

## 2018-11-11 LAB — LIPID PANEL
Cholesterol: 167 mg/dL (ref 0–200)
HDL: 77.4 mg/dL (ref >39.00)
LDL Cholesterol: 82 mg/dL (ref 0–99)
NonHDL: 89.51
TRIGLYCERIDES: 38 mg/dL (ref 0.0–149.0)
Total CHOL/HDL Ratio: 2
VLDL: 7.6 mg/dL (ref 0.0–40.0)

## 2018-11-11 LAB — HEMOGLOBIN A1C: Hgb A1c MFr Bld: 5.4 % (ref 4.6–6.5)

## 2018-11-11 NOTE — Addendum Note (Signed)
Addended by: Abbe Amsterdam C on: 11/11/2018 01:41 PM   Modules accepted: Orders

## 2018-11-16 ENCOUNTER — Encounter: Payer: Self-pay | Admitting: Family Medicine

## 2018-11-16 LAB — CYTOLOGY - PAP
CHLAMYDIA, DNA PROBE: NEGATIVE
Neisseria Gonorrhea: NEGATIVE

## 2018-11-16 NOTE — Addendum Note (Signed)
Addended by: Abbe Amsterdam C on: 11/16/2018 09:16 PM   Modules accepted: Orders

## 2018-12-28 ENCOUNTER — Telehealth: Payer: Self-pay | Admitting: Family Medicine

## 2018-12-28 NOTE — Telephone Encounter (Signed)
Returned call to pt requesting appt for Depo inj.  Pt cannot get inj until May 15-May 29.  LVM for pt to call office back to schedule appt.

## 2019-01-11 NOTE — Telephone Encounter (Signed)
Copied from CRM [id number]. Topic: Appointment Scheduling - Scheduling Inquiry for Clinic >> Jan 10, 2019  4:43 PM Angela Nevin wrote: Patient calling to schedule depo shot for tomorrow, if possible.

## 2019-01-17 NOTE — Telephone Encounter (Signed)
Tried calling pt back 2x to inform her of when she can get her Depo injection based on when the last one was given.  Pt's phone rang and rang on both attempts and disconnected on both attempts, not allowing me to leave a msg for the pt.  If pt calls back, please let her know she can get her injection between 5/15-5/29 and please schedule a nurse visit for her.

## 2019-02-08 ENCOUNTER — Encounter: Payer: Self-pay | Admitting: Obstetrics and Gynecology

## 2019-02-08 ENCOUNTER — Ambulatory Visit (INDEPENDENT_AMBULATORY_CARE_PROVIDER_SITE_OTHER): Payer: 59 | Admitting: Obstetrics and Gynecology

## 2019-02-08 ENCOUNTER — Other Ambulatory Visit: Payer: Self-pay

## 2019-02-08 VITALS — BP 115/65 | HR 92 | Resp 16 | Ht 63.0 in | Wt 113.0 lb

## 2019-02-08 DIAGNOSIS — Z3042 Encounter for surveillance of injectable contraceptive: Secondary | ICD-10-CM | POA: Diagnosis not present

## 2019-02-08 DIAGNOSIS — Z113 Encounter for screening for infections with a predominantly sexual mode of transmission: Secondary | ICD-10-CM

## 2019-02-08 DIAGNOSIS — Z3009 Encounter for other general counseling and advice on contraception: Secondary | ICD-10-CM

## 2019-02-08 DIAGNOSIS — R87612 Low grade squamous intraepithelial lesion on cytologic smear of cervix (LGSIL): Secondary | ICD-10-CM | POA: Diagnosis not present

## 2019-02-08 DIAGNOSIS — Z3202 Encounter for pregnancy test, result negative: Secondary | ICD-10-CM | POA: Diagnosis not present

## 2019-02-08 DIAGNOSIS — Z01812 Encounter for preprocedural laboratory examination: Secondary | ICD-10-CM

## 2019-02-08 LAB — POCT URINE PREGNANCY: Preg Test, Ur: NEGATIVE

## 2019-02-08 MED ORDER — MEDROXYPROGESTERONE ACETATE 150 MG/ML IM SUSP: 150.0000 mg | INTRAMUSCULAR | 3 refills | Status: DC

## 2019-02-08 MED ORDER — MEDROXYPROGESTERONE ACETATE 150 MG/ML IM SUSP
150.0000 mg | INTRAMUSCULAR | Status: DC
  Administered 2019-02-08 – 2022-06-03 (×8): 150 mg via INTRAMUSCULAR

## 2019-02-08 MED FILL — medroxyPROGESTERone ACETATE: 150 | 90 days supply | Qty: 1 | Fill #0

## 2019-02-08 NOTE — Addendum Note (Signed)
Addended by: Anell Barr on: 02/08/2019 01:56 PM   Modules accepted: Orders

## 2019-02-08 NOTE — Progress Notes (Signed)
   GYNECOLOGY OFFICE VISIT NOTE  History:  23 y.o. G0P0000 here today for f/u for LSIL pap 10/2018 from PCP. She denies any abnormal vaginal discharge, bleeding, pelvic pain or other concerns. On depo for contraception.  Patient believes she has had HPV vaccinations.   Past Medical History:  Diagnosis Date  . Eczema     Past Surgical History:  Procedure Laterality Date  . NO PAST SURGERIES       Current Outpatient Medications:  .  medroxyPROGESTERone (DEPO-PROVERA) 150 MG/ML injection, Inject 150 mg into the muscle every 3 (three) months., Disp: , Rfl:  .  triamcinolone cream (KENALOG) 0.1 %, Apply 1 application topically 2 (two) times daily. Use as needed for eczema, Disp: 120 g, Rfl: 3  The following portions of the patient's history were reviewed and updated as appropriate: allergies, current medications, past family history, past medical history, past social history, past surgical history and problem list.    Review of Systems:  Pertinent items noted in HPI and remainder of comprehensive ROS otherwise negative.   Objective:  Physical Exam BP 115/65   Pulse 92   Resp 16   Ht 5\' 3"  (1.6 m)   Wt 113 lb (51.3 kg)   LMP 01/23/2019   BMI 20.02 kg/m  CONSTITUTIONAL: Well-developed, well-nourished female in no acute distress.  HENT:  Normocephalic, atraumatic. External right and left ear normal. Oropharynx is clear and moist EYES: Conjunctivae and EOM are normal. Pupils are equal, round, and reactive to light. No scleral icterus.  NECK: Normal range of motion, supple, no masses SKIN: Skin is warm and dry. No rash noted. Not diaphoretic. No erythema. No pallor. NEUROLOGIC: Alert and oriented to person, place, and time. Normal reflexes, muscle tone coordination. No cranial nerve deficit noted. PSYCHIATRIC: Normal mood and affect. Normal behavior. Normal judgment and thought content. CARDIOVASCULAR: Normal heart rate noted RESPIRATORY: Effort normal, no problems with  respiration noted ABDOMEN: Soft, no distention noted.   PELVIC: deferred MUSCULOSKELETAL: Normal range of motion. No edema noted.  Labs and Imaging No results found.  Assessment & Plan:    1. Pre-procedure lab exam - POCT urine pregnancy  2. Routine screening for STI (sexually transmitted infection) - Recommended yearly STI screen, reviewed risks/benefits, she is agreeable - had GC/CT done at annual in 2/20 - HIV antibody (with reflex) - Hepatitis C Antibody - Hepatitis B surface antigen - RPR - Urine cytology ancillary only([facility]) (trichomonas)  3. LGSIL on Pap smear of cervix - Reviewed pap smears, why we recommend testing and recommendation for f/u at 1 yr - per EMR, patient has UTD on HPV vaccine (received 3 doses in 2011) - repeat pap in 10/2019  4. Encounter for counseling regarding contraception - She is getting depo from PCP, would like to continue, due for next dose - within window, will give here today and patient will f/u with [place] for future shots - to call if she decides she would like another method   Routine preventative health maintenance measures emphasized. Please refer to After Visit Summary for other counseling recommendations.   Return in about 3 months (around 05/11/2019) for 3 months for depo, 2/20 for pap smear.   Total face-to-face time with patient: 20 minutes. Over 50% of encounter was spent on counseling and coordination of care.   Baldemar Lenis, M.D. Attending [facility] [employer] [facility])

## 2019-02-09 LAB — HEPATITIS B SURFACE ANTIGEN: Hepatitis B Surface Ag: NEGATIVE

## 2019-02-09 LAB — RPR: RPR Ser Ql: NONREACTIVE

## 2019-02-09 LAB — HIV ANTIBODY (ROUTINE TESTING W REFLEX): HIV Screen 4th Generation wRfx: NONREACTIVE

## 2019-02-09 LAB — HEPATITIS C ANTIBODY: Hep C Virus Ab: 0.1 s/co ratio (ref 0.0–0.9)

## 2019-02-15 LAB — URINE CYTOLOGY ANCILLARY ONLY: Trichomonas: NEGATIVE

## 2019-02-16 ENCOUNTER — Telehealth: Payer: 59 | Admitting: Physician Assistant

## 2019-02-16 DIAGNOSIS — J029 Acute pharyngitis, unspecified: Secondary | ICD-10-CM

## 2019-02-16 NOTE — Progress Notes (Signed)
I have spent 5 minutes in review of e-visit questionnaire, review and updating patient chart, medical decision making and response to patient.   Zinedine Ellner Cody Dalal Livengood, PA-C    

## 2019-02-16 NOTE — Progress Notes (Signed)

## 2019-02-17 ENCOUNTER — Other Ambulatory Visit: Payer: Self-pay

## 2019-02-17 ENCOUNTER — Telehealth: Payer: Self-pay | Admitting: *Deleted

## 2019-02-17 ENCOUNTER — Other Ambulatory Visit: Payer: 59

## 2019-02-17 ENCOUNTER — Ambulatory Visit (INDEPENDENT_AMBULATORY_CARE_PROVIDER_SITE_OTHER): Payer: 59 | Admitting: Internal Medicine

## 2019-02-17 ENCOUNTER — Encounter: Payer: Self-pay | Admitting: Internal Medicine

## 2019-02-17 DIAGNOSIS — J020 Streptococcal pharyngitis: Secondary | ICD-10-CM | POA: Diagnosis not present

## 2019-02-17 DIAGNOSIS — Z20822 Contact with and (suspected) exposure to covid-19: Secondary | ICD-10-CM

## 2019-02-17 MED ORDER — AMOXICILLIN 875 MG PO TABS: 875.0000 mg | ORAL_TABLET | Freq: Two times a day (BID) | ORAL | 0 refills | Status: DC

## 2019-02-17 MED FILL — AMOXICILLIN 875 MG TABS: 875 | 10 days supply | Qty: 20 | Fill #0

## 2019-02-17 NOTE — Telephone Encounter (Signed)
Pt called and scheduled for testing at [facility] on 02/17/19 at 3:45 pm. Pt given address of testing site.Pt advised to wear a mask and to remain in car at time of appt. Understanding verbalized.

## 2019-02-17 NOTE — Telephone Encounter (Signed)
-----   Message from Wanda Plump, MD sent at 02/17/2019  2:28 PM EDT ----- Regarding: Needs COVID testing She has respiratory symptoms, I prescribed amoxicillin, needs call with testing.  See my note

## 2019-02-17 NOTE — Progress Notes (Signed)
Subjective:    Patient ID: Jessica Potter, female    DOB: 09-12-1996, 23 y.o.   MRN: [id number]  DOS:  02/17/2019 Type of visit - description: Virtual Visit via Video Note  I connected with@ on 02/18/19 at  2:00 PM EDT by a video enabled telemedicine application and verified that I am speaking with the correct person using two identifiers.   THIS ENCOUNTER IS A VIRTUAL VISIT DUE TO COVID-19 - PATIENT WAS NOT SEEN IN THE OFFICE. PATIENT HAS CONSENTED TO VIRTUAL VISIT / TELEMEDICINE VISIT   Location of patient: home  Location of provider: office  I discussed the limitations of evaluation and management by telemedicine and the availability of in person appointments. The patient expressed understanding and agreed to proceed.  History of Present Illness: Acute visit: Symptoms of started 5 days ago with sore throat, pain increased with swallowing, a gland at the right side of the neck is swollen, she has some headaches.  She is on Depo shots, last one was 02/08/2019  per patient.  Review of Systems No fever No sinus congestion No chest pain no difficulty breathing. No dental problems or dental pain No fatigue No rash Has a mild dry cough. No change in her voice, no difficulty breathing or swallowing  Past Medical History:  Diagnosis Date  . Eczema     Past Surgical History:  Procedure Laterality Date  . NO PAST SURGERIES      Social History   Socioeconomic History  . Marital status: Single    Spouse name: Not on file  . Number of children: Not on file  . Years of education: Not on file  . Highest education level: Not on file  Occupational History  . Not on file  Social Needs  . Financial resource strain: Not on file  . Food insecurity:    Worry: Not on file    Inability: Not on file  . Transportation needs:    Medical: Not on file    Non-medical: Not on file  Tobacco Use  . Smoking status: Never Smoker  . Smokeless tobacco: Never Used  Substance and Sexual  Activity  . Alcohol use: No  . Drug use: No  . Sexual activity: Yes    Birth control/protection: Injection  Lifestyle  . Physical activity:    Days per week: Not on file    Minutes per session: Not on file  . Stress: Not on file  Relationships  . Social connections:    Talks on phone: Not on file    Gets together: Not on file    Attends religious service: Not on file    Active member of club or organization: Not on file    Attends meetings of clubs or organizations: Not on file    Relationship status: Not on file  . Intimate partner violence:    Fear of current or ex partner: Not on file    Emotionally abused: Not on file    Physically abused: Not on file    Forced sexual activity: Not on file  Other Topics Concern  . Not on file  Social History Narrative  . Not on file      Allergies as of 02/17/2019   No Known Allergies     Medication List       Accurate as of February 17, 2019 11:59 PM. If you have any questions, ask your nurse or doctor.        amoxicillin 875 MG tablet Commonly known  as:  AMOXIL Take 1 tablet (875 mg total) by mouth 2 (two) times daily. Started by:  Willow OraJose Urias Sheek, MD   medroxyPROGESTERone 150 MG/ML injection Commonly known as:  DEPO-PROVERA Inject 150 mg into the muscle every 3 (three) months.   medroxyPROGESTERone 150 MG/ML injection Commonly known as:  DEPO-PROVERA Inject 1 mL (150 mg total) into the muscle every 3 (three) months.   triamcinolone cream 0.1 % Commonly known as:  KENALOG Apply 1 application topically 2 (two) times daily. Use as needed for eczema           Objective:   Physical Exam LMP 01/23/2019  This is a virtual video visit, she is alert oriented x3, no apparent distress. I looked at her neck and there is no gross asymmetry.  Voice is normal, no stridor       Assessment    Pharyngitis Symptoms consistent with pharyngitis No obvious complications or red flags. Recommend amoxicillin, rest, fluids, Tylenol,  Robitussin-DM. Call if not better specifically call if having difficulty swallowing or breathing. We will send a message with the summary of the instructions although she verbalized understanding COVID-19 pandemia: In the midst of the pandemia will check her for COVID-19.  [facility].

## 2019-02-19 LAB — NOVEL CORONAVIRUS, NAA: SARS-CoV-2, NAA: NOT DETECTED

## 2019-04-07 ENCOUNTER — Other Ambulatory Visit: Payer: Self-pay

## 2019-04-09 NOTE — Progress Notes (Signed)
[facility] [address] [place], [place] [address] [phone]  Date:  04/10/2019   Name:  Jessica Potter   DOB:  Feb 10, 1996   MRN:  [id number]  PCP:  Darreld Mclean, MD    Chief Complaint: No chief complaint on file.   History of Present Illness:  Jessica Potter is a 23 y.o. very pleasant female patient who presents with the following:  Generally healthy young woman here today for a virtual visit to discuss illness Pt location is home, provider location is office Pt ID confirmed with 2 factors, she gives consent for visit today   She has noted a cough for about 2 weeks Fever for the last 4 days- up to 99.?- did not go over 100 She also notes a tender node in her right neck and a ST No vomiting or diarrhea No earache or sneezing No belly pain  She did take some tyquil for her sx   Patient Active Problem List   Diagnosis Date Noted  . Trapezius muscle strain, left, initial encounter 02/09/2017  . Flexural eczema 02/01/2017  . Seasonal allergic rhinitis 04/14/2016    Past Medical History:  Diagnosis Date  . Eczema     Past Surgical History:  Procedure Laterality Date  . NO PAST SURGERIES      Social History   Tobacco Use  . Smoking status: Never Smoker  . Smokeless tobacco: Never Used  Substance Use Topics  . Alcohol use: No  . Drug use: No    Family History  Problem Relation Age of Onset  . Diabetes Maternal Grandmother     No Known Allergies  Medication list has been reviewed and updated.  Current Outpatient Medications on File Prior to Visit  Medication Sig Dispense Refill  . amoxicillin (AMOXIL) 875 MG tablet Take 1 tablet (875 mg total) by mouth 2 (two) times daily. 20 tablet 0  . medroxyPROGESTERone (DEPO-PROVERA) 150 MG/ML injection Inject 150 mg into the muscle every 3 (three) months.    . medroxyPROGESTERone (DEPO-PROVERA) 150 MG/ML injection Inject 1 mL (150 mg total) into  the muscle every 3 (three) months. 1 mL 3  . triamcinolone cream (KENALOG) 0.1 % Apply 1 application topically 2 (two) times daily. Use as needed for eczema 120 g 3   Current Facility-Administered Medications on File Prior to Visit  Medication Dose Route Frequency Provider Last Rate Last Dose  . medroxyPROGESTERone (DEPO-PROVERA) injection 150 mg  150 mg Intramuscular Q90 days Sloan Leiter, MD   150 mg at 02/08/19 1354    Review of Systems:  As per HPI- otherwise negative.   Physical Examination: There were no vitals filed for this visit. There were no vitals filed for this visit. There is no height or weight on file to calculate BMI. Ideal Body Weight:     Pt observed over video She looks well- no cough, wheezing or distress is noted  Assessment and Plan:   ICD-10-CM   1. Low grade fever  R50.9   2. Cough  R05      Follow-up: No follow-ups on file.  No orders of the defined types were placed in this encounter.  No orders of the defined types were placed in this encounter.   @SIGN @  Virtual visit today for cough for about 2 weeks and now low grade fever, ST Considerations are viral infection, COVID, strep, other Offered to order an ambulatory COVID test vs urgent  care visit [facility]  I [facility] [place]  Asked her to let me know if I can be of further service   Signed Abbe AmsterdamJessica Amoreena Neubert, MD

## 2019-04-10 ENCOUNTER — Ambulatory Visit (INDEPENDENT_AMBULATORY_CARE_PROVIDER_SITE_OTHER): Payer: 59 | Admitting: Family Medicine

## 2019-04-10 ENCOUNTER — Encounter: Payer: Self-pay | Admitting: Family Medicine

## 2019-04-10 ENCOUNTER — Other Ambulatory Visit: Payer: Self-pay

## 2019-04-10 DIAGNOSIS — Z20828 Contact with and (suspected) exposure to other viral communicable diseases: Secondary | ICD-10-CM | POA: Diagnosis not present

## 2019-04-10 DIAGNOSIS — R05 Cough: Secondary | ICD-10-CM

## 2019-04-10 DIAGNOSIS — R509 Fever, unspecified: Secondary | ICD-10-CM

## 2019-04-10 DIAGNOSIS — R059 Cough, unspecified: Secondary | ICD-10-CM

## 2019-04-26 ENCOUNTER — Ambulatory Visit (INDEPENDENT_AMBULATORY_CARE_PROVIDER_SITE_OTHER): Payer: 59 | Admitting: *Deleted

## 2019-04-26 ENCOUNTER — Other Ambulatory Visit: Payer: Self-pay

## 2019-04-26 ENCOUNTER — Encounter: Payer: Self-pay | Admitting: Medical

## 2019-04-26 ENCOUNTER — Ambulatory Visit (INDEPENDENT_AMBULATORY_CARE_PROVIDER_SITE_OTHER): Payer: 59

## 2019-04-26 VITALS — BP 105/69 | HR 88 | Ht 63.0 in | Wt 111.1 lb

## 2019-04-26 DIAGNOSIS — Z3042 Encounter for surveillance of injectable contraceptive: Secondary | ICD-10-CM

## 2019-04-26 DIAGNOSIS — Z23 Encounter for immunization: Secondary | ICD-10-CM | POA: Diagnosis not present

## 2019-04-26 NOTE — Progress Notes (Signed)
Patient here for TDaP because she was due for one  Vaccine given and patient tolerated well.

## 2019-04-26 NOTE — Progress Notes (Signed)
Patient seen and assessed by nursing staff during this encounter. I have reviewed the chart and agree with the documentation and plan.  Kerry Hough, PA-C 04/26/2019 9:42 AM

## 2019-04-26 NOTE — Progress Notes (Signed)
Jessica Potter here for Depo-Provera  Injection.  Injection administered without complication. Patient will return in 3 months for next injection.  Loranzo Desha l Amer Alcindor, CMA 04/26/2019  9:20 AM

## 2019-07-13 MED FILL — medroxyPROGESTERone ACETATE: 150 | 90 days supply | Qty: 1 | Fill #1

## 2019-07-13 MED FILL — TRIAMCINOLONE ACETONIDE 0.1: 0.1 | 20 days supply | Qty: 80 | Fill #1

## 2019-07-14 ENCOUNTER — Ambulatory Visit (INDEPENDENT_AMBULATORY_CARE_PROVIDER_SITE_OTHER): Payer: 59

## 2019-07-14 ENCOUNTER — Other Ambulatory Visit: Payer: Self-pay

## 2019-07-14 VITALS — BP 117/65 | HR 101 | Wt 120.0 lb

## 2019-07-14 DIAGNOSIS — Z3042 Encounter for surveillance of injectable contraceptive: Secondary | ICD-10-CM

## 2019-07-14 NOTE — Progress Notes (Signed)
Patient presented for depo provera injection. [name] RN

## 2019-07-14 NOTE — Progress Notes (Signed)
Chart reviewed - agree with RN documentation.   

## 2019-10-13 ENCOUNTER — Other Ambulatory Visit: Payer: Self-pay

## 2019-10-13 ENCOUNTER — Ambulatory Visit (INDEPENDENT_AMBULATORY_CARE_PROVIDER_SITE_OTHER): Payer: 59

## 2019-10-13 VITALS — BP 116/76 | HR 96 | Ht 63.0 in | Wt 122.1 lb

## 2019-10-13 DIAGNOSIS — Z3042 Encounter for surveillance of injectable contraceptive: Secondary | ICD-10-CM | POA: Diagnosis not present

## 2019-10-13 MED ORDER — MEDROXYPROGESTERONE ACETATE 150 MG/ML IM SUSP
150.0000 mg | Freq: Once | INTRAMUSCULAR | Status: AC
  Administered 2019-10-13: 11:00:00 150 mg via INTRAMUSCULAR

## 2019-10-13 MED ORDER — MEDROXYPROGESTERONE ACETATE 150 MG/ML IM SUSP: 150.0000 mg | INTRAMUSCULAR | 3 refills | Status: DC

## 2019-10-13 MED FILL — medroxyPROGESTERone ACETATE: 150 | 84 days supply | Qty: 1 | Fill #0

## 2019-10-13 NOTE — Progress Notes (Addendum)
Jessica Potter here for Depo-Provera  Injection.  Injection administered without complication. Patient will return in 3 months for next injection.  Dalinda Heidt l Aki Abalos, CMA 10/13/2019  11:02 AM  Attestation of Attending Supervision of CMA/RN: Evaluation and management procedures were performed by the nurse under my supervision and collaboration.  I have reviewed the nursing note and chart, and I agree with the management and plan.  Carolyn L. Harraway-Smith, M.D., [facility]

## 2019-12-28 MED FILL — MEDROXYPROGESTERONE ACETATE: 150 | 90 days supply | Qty: 1 | Fill #2

## 2019-12-29 ENCOUNTER — Ambulatory Visit (INDEPENDENT_AMBULATORY_CARE_PROVIDER_SITE_OTHER): Payer: 59

## 2019-12-29 VITALS — BP 124/71 | HR 116 | Ht 63.0 in | Wt 122.1 lb

## 2019-12-29 DIAGNOSIS — Z3042 Encounter for surveillance of injectable contraceptive: Secondary | ICD-10-CM

## 2019-12-29 MED ORDER — MEDROXYPROGESTERONE ACETATE 150 MG/ML IM SUSP
150.0000 mg | Freq: Once | INTRAMUSCULAR | Status: AC
  Administered 2019-12-29: 09:00:00 150 mg via INTRAMUSCULAR

## 2019-12-29 NOTE — Progress Notes (Signed)
Jessica Potter here for Depo-Provera  Injection.  Injection administered without complication. Patient will return in 3 months for next injection.  Ankita Newcomer l Keola Heninger, CMA 12/29/2019  9:11 AM

## 2019-12-29 NOTE — Progress Notes (Signed)
Chart reviewed - agree with CMA/RN documentation.  ° °

## 2020-01-06 NOTE — Patient Instructions (Addendum)
It was good to see you again today, I will be in touch with your results as soon as possible You have plans to get your pap with GYN later this summer  For allergies, I would first suggest that you try Allegra and perhaps a steroid nasal spray- Flonase or nasacort If these do not help, fill and use the singulair rx that I gave you today    Health Maintenance, Female Adopting a healthy lifestyle and getting preventive care are important in promoting health and wellness. Ask your health care provider about:  The right schedule for you to have regular tests and exams.  Things you can do on your own to prevent diseases and keep yourself healthy. What should I know about diet, weight, and exercise? Eat a healthy diet   Eat a diet that includes plenty of vegetables, fruits, low-fat dairy products, and lean protein.  Do not eat a lot of foods that are high in solid fats, added sugars, or sodium. Maintain a healthy weight Body mass index (BMI) is used to identify weight problems. It estimates body fat based on height and weight. Your health care provider can help determine your BMI and help you achieve or maintain a healthy weight. Get regular exercise Get regular exercise. This is one of the most important things you can do for your health. Most adults should:  Exercise for at least 150 minutes each week. The exercise should increase your heart rate and make you sweat (moderate-intensity exercise).  Do strengthening exercises at least twice a week. This is in addition to the moderate-intensity exercise.  Spend less time sitting. Even light physical activity can be beneficial. Watch cholesterol and blood lipids Have your blood tested for lipids and cholesterol at 23 years of age, then have this test every 5 years. Have your cholesterol levels checked more often if:  Your lipid or cholesterol levels are high.  You are older than 23 years of age.  You are at high risk for heart  disease. What should I know about cancer screening? Depending on your health history and family history, you may need to have cancer screening at various ages. This may include screening for:  Breast cancer.  Cervical cancer.  Colorectal cancer.  Skin cancer.  Lung cancer. What should I know about heart disease, diabetes, and high blood pressure? Blood pressure and heart disease  High blood pressure causes heart disease and increases the risk of stroke. This is more likely to develop in people who have high blood pressure readings, are of African descent, or are overweight.  Have your blood pressure checked: ? Every 3-5 years if you are 1-59 years of age. ? Every year if you are 50 years old or older. Diabetes Have regular diabetes screenings. This checks your fasting blood sugar level. Have the screening done:  Once every three years after age 46 if you are at a normal weight and have a low risk for diabetes.  More often and at a younger age if you are overweight or have a high risk for diabetes. What should I know about preventing infection? Hepatitis B If you have a higher risk for hepatitis B, you should be screened for this virus. Talk with your health care provider to find out if you are at risk for hepatitis B infection. Hepatitis C Testing is recommended for:  Everyone born from [birth year] through 1964.  Anyone with known risk factors for hepatitis C. Sexually transmitted infections (STIs)  Get screened for STIs,  including gonorrhea and chlamydia, if: ? You are sexually active and are younger than 23 years of age. ? You are older than 23 years of age and your health care provider tells you that you are at risk for this type of infection. ? Your sexual activity has changed since you were last screened, and you are at increased risk for chlamydia or gonorrhea. Ask your health care provider if you are at risk.  Ask your health care provider about whether you are at high  risk for HIV. Your health care provider may recommend a prescription medicine to help prevent HIV infection. If you choose to take medicine to prevent HIV, you should first get tested for HIV. You should then be tested every 3 months for as long as you are taking the medicine. Pregnancy  If you are about to stop having your period (premenopausal) and you may become pregnant, seek counseling before you get pregnant.  Take 400 to 800 micrograms (mcg) of folic acid every day if you become pregnant.  Ask for birth control (contraception) if you want to prevent pregnancy. Osteoporosis and menopause Osteoporosis is a disease in which the bones lose minerals and strength with aging. This can result in bone fractures. If you are 74 years old or older, or if you are at risk for osteoporosis and fractures, ask your health care provider if you should:  Be screened for bone loss.  Take a calcium or vitamin D supplement to lower your risk of fractures.  Be given hormone replacement therapy (HRT) to treat symptoms of menopause. Follow these instructions at home: Lifestyle  Do not use any products that contain nicotine or tobacco, such as cigarettes, e-cigarettes, and chewing tobacco. If you need help quitting, ask your health care provider.  Do not use street drugs.  Do not share needles.  Ask your health care provider for help if you need support or information about quitting drugs. Alcohol use  Do not drink alcohol if: ? Your health care provider tells you not to drink. ? You are pregnant, may be pregnant, or are planning to become pregnant.  If you drink alcohol: ? Limit how much you use to 0-1 drink a day. ? Limit intake if you are breastfeeding.  Be aware of how much alcohol is in your drink. In the [place] one drink equals one 12 oz bottle of beer (355 mL), one 5 oz glass of wine (148 mL), or one 1 oz glass of hard liquor (44 mL). General instructions  Schedule regular health, dental,  and eye exams.  Stay current with your vaccines.  Tell your health care provider if: ? You often feel depressed. ? You have ever been abused or do not feel safe at home. Summary  Adopting a healthy lifestyle and getting preventive care are important in promoting health and wellness.  Follow your health care provider's instructions about healthy diet, exercising, and getting tested or screened for diseases.  Follow your health care provider's instructions on monitoring your cholesterol and blood pressure. This information is not intended to replace advice given to you by your health care provider. Make sure you discuss any questions you have with your health care provider. Document Revised: 08/24/2018 Document Reviewed: 08/24/2018 Elsevier Patient Education  2020 [employer].

## 2020-01-06 NOTE — Progress Notes (Addendum)
[facility] [address], [place], [place] [address] [phone]  Date:  01/08/2020   Name:  Jessica Potter   DOB:  1995-10-02   MRN:  [id number]  PCP:  Darreld Mclean, MD    Chief Complaint: Annual Exam   History of Present Illness:  Jessica Potter is a 24 y.o. very pleasant female patient who presents with the following:  Generally healthy young woman here today for physical  Last seen by myself last July for virtual visit with illness-at that time she planned to be seen at urgent care for Covid 19 and strep testing She did not have covid at that time  COVID-19 vaccine- not done yet  Otherwise immunizations are up-to-date, Gardasil and tetanus done Labs 2020, reassuring Her Pap in 2020 showed LSIL Adequacy Satisfactory for evaluation endocervical/transformation zone component PRESENT.Abnormal    Diagnosis LOW GRADE SQUAMOUS INTRAEPITHELIAL LESION: CIN-1/ HPV (LSIL).Abnormal    Chlamydia Negative   Comment: Normal Reference Range - Negative  Neisseria Gonorrhea Negative   Comment: Normal Reference Range - Negative  Material Submitted CervicoVaginal Pap [ThinPrep Imaged]Abnormal      I referred her to gynecology at that time for evaluation of abnormal Pap She was seen by Dr. Vivien Rota who recommended repeat Pap in 1 year-we can do this for her today However she is seeing Dr Rosana Hoes for this in June so does not need Pap today  She uses depo for contraception -she is happy with this method She is working at [employer].  She is somewhat active at her job though she does not get a lot of formal exercise No smoking or alcohol    Her only concern today is that her throat has been a bit itchy the last couple of weeks, she has noted sneezing and itchy eyes.  She has tried over-the-counter Claritin without too much success  Patient Active Problem List   Diagnosis Date Noted  . Flexural eczema  02/01/2017  . Seasonal allergic rhinitis 04/14/2016    Past Medical History:  Diagnosis Date  . Eczema     Past Surgical History:  Procedure Laterality Date  . NO PAST SURGERIES      Social History   Tobacco Use  . Smoking status: Never Smoker  . Smokeless tobacco: Never Used  Substance Use Topics  . Alcohol use: No  . Drug use: No    Family History  Problem Relation Age of Onset  . Diabetes Maternal Grandmother     No Known Allergies  Medication list has been reviewed and updated.  Current Outpatient Medications on File Prior to Visit  Medication Sig Dispense Refill  . medroxyPROGESTERone (DEPO-PROVERA) 150 MG/ML injection Inject 1 mL (150 mg total) into the muscle every 3 (three) months. 1 mL 3   Current Facility-Administered Medications on File Prior to Visit  Medication Dose Route Frequency Provider Last Rate Last Admin  . medroxyPROGESTERone (DEPO-PROVERA) injection 150 mg  150 mg Intramuscular Q90 days Sloan Leiter, MD   150 mg at 04/26/19 0920    Review of Systems:  As per HPI- otherwise negative.   Physical Examination: Vitals:   01/08/20 1042  BP: 118/78  Pulse: (!) 108  Resp: 16  Temp: 97.7 F (36.5 C)  SpO2: 99%   Vitals:   01/08/20 1042  Weight: 122 lb (55.3 kg)  Height: 5\' 3"  (1.6 m)   Body mass index is 21.61 kg/m. Ideal  Body Weight: Weight in (lb) to have BMI = 25: 140.8  GEN: no acute distress.  Normal weight, petite build.  Looks well HEENT: Atraumatic, Normocephalic.   Bilateral TM wnl, oropharynx normal.  PEERL,EOMI.   Ears and Nose: No external deformity. CV: RRR-she is mildly tachycardic as is typical for, No M/G/R. No JVD. No thrill. No extra heart sounds. PULM: CTA B, no wheezes, crackles, rhonchi. No retractions. No resp. distress. No accessory muscle use. ABD: S, NT, ND, +BS. No rebound. No HSM. EXTR: No c/c/e PSYCH: Normally interactive. Conversant.   Pulse Readings from Last 3 Encounters:  01/08/20 (!) 108   12/29/19 (!) 116  10/13/19 96    Assessment and Plan: Physical exam  Screening for cervical cancer  Screening for deficiency anemia - Plan: CBC  Screening for thyroid disorder - Plan: TSH  Screening for hyperlipidemia - Plan: Lipid panel  Screening for diabetes mellitus - Plan: Comprehensive metabolic panel, Hemoglobin A1c  Seasonal allergic rhinitis due to pollen - Plan: montelukast (SINGULAIR) 10 MG tablet  Here today for complete physical exam Her Pap is scheduled with GYN Routine labs pending as above. Will plan further follow- up pending labs.   Discussed allergic rhinitis.  Encouraged her to try Allegra and or a nasal steroid spray such as Flonase.  If this does not resolve her symptoms, she may try Singulair-gave her a printed prescription This visit occurred during the SARS-CoV-2 public health emergency.  Safety protocols were in place, including screening questions prior to the visit, additional usage of staff PPE, and extensive cleaning of exam room while observing appropriate contact time as indicated for disinfecting solutions.   Encourage exercise, healthy diet, alcohol and tobacco avoidance Signed Abbe Amsterdam, MD  Received her labs as below, message to patient  Results for orders placed or performed in visit on 01/08/20  CBC  Result Value Ref Range   WBC 5.8 4.0 - 10.5 K/uL   RBC 4.56 3.87 - 5.11 Mil/uL   Platelets 244.0 150.0 - 400.0 K/uL   Hemoglobin 13.7 12.0 - 15.0 g/dL   HCT 27.7 82.4 - 23.5 %   MCV 91.3 78.0 - 100.0 fl   MCHC 32.9 30.0 - 36.0 g/dL   RDW 36.1 44.3 - 15.4 %  Comprehensive metabolic panel  Result Value Ref Range   Sodium 138 135 - 145 mEq/L   Potassium 3.7 3.5 - 5.1 mEq/L   Chloride 105 96 - 112 mEq/L   CO2 27 19 - 32 mEq/L   Glucose, Bld 68 (L) 70 - 99 mg/dL   BUN 9 6 - 23 mg/dL   Creatinine, Ser 0.08 0.40 - 1.20 mg/dL   Total Bilirubin 0.6 0.2 - 1.2 mg/dL   Alkaline Phosphatase 62 39 - 117 U/L   AST 14 0 - 37 U/L   ALT  13 0 - 35 U/L   Total Protein 7.1 6.0 - 8.3 g/dL   Albumin 4.4 3.5 - 5.2 g/dL   GFR 676.19 >50.93 mL/min   Calcium 9.4 8.4 - 10.5 mg/dL  Hemoglobin O6Z  Result Value Ref Range   Hgb A1c MFr Bld 5.3 4.6 - 6.5 %  Lipid panel  Result Value Ref Range   Cholesterol 166 0 - 200 mg/dL   Triglycerides 12.4 0.0 - 149.0 mg/dL   HDL 58.09 >98.33 mg/dL   VLDL 82.5 0.0 - 05.3 mg/dL   LDL Cholesterol 976 (H) 0 - 99 mg/dL   Total CHOL/HDL Ratio 4    NonHDL 121.81  TSH  Result Value Ref Range   TSH 1.36 0.35 - 4.50 uIU/mL

## 2020-01-08 ENCOUNTER — Ambulatory Visit (INDEPENDENT_AMBULATORY_CARE_PROVIDER_SITE_OTHER): Payer: 59 | Admitting: Family Medicine

## 2020-01-08 ENCOUNTER — Other Ambulatory Visit: Payer: Self-pay

## 2020-01-08 ENCOUNTER — Encounter: Payer: Self-pay | Admitting: Family Medicine

## 2020-01-08 VITALS — BP 118/78 | HR 106 | Temp 97.7°F | Resp 16 | Ht 63.0 in | Wt 122.0 lb

## 2020-01-08 DIAGNOSIS — Z131 Encounter for screening for diabetes mellitus: Secondary | ICD-10-CM | POA: Diagnosis not present

## 2020-01-08 DIAGNOSIS — Z130 Encounter for screening for diseases of the blood and blood-forming organs and certain disorders involving the immune mechanism: Secondary | ICD-10-CM

## 2020-01-08 DIAGNOSIS — Z0000 Encounter for general adult medical examination without abnormal findings: Secondary | ICD-10-CM | POA: Diagnosis not present

## 2020-01-08 DIAGNOSIS — Z124 Encounter for screening for malignant neoplasm of cervix: Secondary | ICD-10-CM

## 2020-01-08 DIAGNOSIS — Z1329 Encounter for screening for other suspected endocrine disorder: Secondary | ICD-10-CM

## 2020-01-08 DIAGNOSIS — Z13220 Encounter for screening for lipoid disorders: Secondary | ICD-10-CM | POA: Diagnosis not present

## 2020-01-08 DIAGNOSIS — J301 Allergic rhinitis due to pollen: Secondary | ICD-10-CM

## 2020-01-08 LAB — CBC
HCT: 41.6 % (ref 36.0–46.0)
Hemoglobin: 13.7 g/dL (ref 12.0–15.0)
MCHC: 32.9 g/dL (ref 30.0–36.0)
MCV: 91.3 fl (ref 78.0–100.0)
Platelets: 244 10*3/uL (ref 150.0–400.0)
RBC: 4.56 Mil/uL (ref 3.87–5.11)
RDW: 13 % (ref 11.5–15.5)
WBC: 5.8 10*3/uL (ref 4.0–10.5)

## 2020-01-08 LAB — LIPID PANEL
Cholesterol: 166 mg/dL (ref 0–200)
HDL: 43.8 mg/dL (ref >39.00)
LDL Cholesterol: 111 mg/dL — ABNORMAL HIGH (ref 0–99)
NonHDL: 121.81
Total CHOL/HDL Ratio: 4
Triglycerides: 56 mg/dL (ref 0.0–149.0)
VLDL: 11.2 mg/dL (ref 0.0–40.0)

## 2020-01-08 LAB — COMPREHENSIVE METABOLIC PANEL
ALT: 13 U/L (ref 0–35)
AST: 14 U/L (ref 0–37)
Albumin: 4.4 g/dL (ref 3.5–5.2)
Alkaline Phosphatase: 62 U/L (ref 39–117)
BUN: 9 mg/dL (ref 6–23)
CO2: 27 mEq/L (ref 19–32)
Calcium: 9.4 mg/dL (ref 8.4–10.5)
Chloride: 105 mEq/L (ref 96–112)
Creatinine, Ser: 0.73 mg/dL (ref 0.40–1.20)
GFR: 120.28 mL/min (ref >60.00)
Glucose, Bld: 68 mg/dL — ABNORMAL LOW (ref 70–99)
Potassium: 3.7 mEq/L (ref 3.5–5.1)
Sodium: 138 mEq/L (ref 135–145)
Total Bilirubin: 0.6 mg/dL (ref 0.2–1.2)
Total Protein: 7.1 g/dL (ref 6.0–8.3)

## 2020-01-08 LAB — HEMOGLOBIN A1C: Hgb A1c MFr Bld: 5.3 % (ref 4.6–6.5)

## 2020-01-08 LAB — TSH: TSH: 1.36 u[IU]/mL (ref 0.35–4.50)

## 2020-01-08 MED ORDER — MONTELUKAST SODIUM 10 MG PO TABS: 10.0000 mg | ORAL_TABLET | Freq: Every day | ORAL | 5 refills | Status: DC

## 2020-03-13 ENCOUNTER — Other Ambulatory Visit (HOSPITAL_COMMUNITY)
Admission: RE | Admit: 2020-03-13 | Discharge: 2020-03-13 | Disposition: A | Discharge status: Home / Self Care | Payer: Self-pay | Source: Ambulatory Visit | Attending: Obstetrics & Gynecology | Admitting: Obstetrics & Gynecology

## 2020-03-13 ENCOUNTER — Encounter: Payer: Self-pay | Admitting: Obstetrics & Gynecology

## 2020-03-13 ENCOUNTER — Other Ambulatory Visit: Payer: Self-pay

## 2020-03-13 ENCOUNTER — Ambulatory Visit (HOSPITAL_BASED_OUTPATIENT_CLINIC_OR_DEPARTMENT_OTHER): Payer: 59 | Admitting: Obstetrics & Gynecology

## 2020-03-13 VITALS — BP 110/85 | Ht 63.0 in | Wt 113.0 lb

## 2020-03-13 DIAGNOSIS — Z01419 Encounter for gynecological examination (general) (routine) without abnormal findings: Secondary | ICD-10-CM | POA: Insufficient documentation

## 2020-03-13 DIAGNOSIS — R87612 Low grade squamous intraepithelial lesion on cytologic smear of cervix (LGSIL): Secondary | ICD-10-CM

## 2020-03-13 DIAGNOSIS — Z113 Encounter for screening for infections with a predominantly sexual mode of transmission: Secondary | ICD-10-CM

## 2020-03-13 MED FILL — MEDROXYPROGESTERONE ACETATE: 150 | 84 days supply | Qty: 1 | Fill #1

## 2020-03-13 NOTE — Patient Instructions (Signed)
Preventing Sexually Transmitted Infections, Adult Sexually transmitted infections (STIs) are diseases that are passed (transmitted) from person to person through bodily fluids exchanged during sex or sexual contact. Bodily fluids include saliva, semen, blood, vaginal mucus, and urine. You may have an increased risk for developing an STI if you have unprotected oral, vaginal, or anal sex. Some common STIs include:  Herpes.  Hepatitis B.  Chlamydia.  Gonorrhea.  Syphilis.  HPV (human papillomavirus).  HIV (human immunodeficiency virus), the virus that can cause AIDS (acquired immunodeficiency syndrome). How can I protect myself from sexually transmitted infections? The only way to completely prevent STIs is not to have sex of any kind (practice abstinence). This includes oral, vaginal, or anal sex. If you are sexually active, take these actions to lower your risk of getting an STI:  Have only one sex partner (be monogamous) or limit the number of sexual partners you have.  Stay up-to-date on immunizations. Certain vaccines can lower your risk of getting certain STIs, such as: ? Hepatitis A and B vaccines. You may have been vaccinated as a young child, but likely need a booster shot as a teen or young adult. ? HPV vaccine.  Use methods that prevent the exchange of body fluids between partners (barrier protection) every time you have sex. Barrier protection can be used during oral, vaginal, or anal sex. Commonly used barrier methods include: ? Female condom. ? Female condom. ? Dental dam.  Get tested regularly for STIs. Have your sexual partner get tested regularly as well.  Avoid mixing alcohol, drugs, and sex. Alcohol and drug use can affect your ability to make good decisions and can lead to risky sexual behaviors.  Ask your health care provider about taking pre-exposure prophylaxis (PrEP) to prevent HIV infection if you: ? Have a HIV-positive sexual partner. ? Have multiple sexual  partners or partners who do not know their HIV status, and do not regularly use a condom during sex. ? Use injection drugs and share needles. Birth control pills, injections, implants, and intrauterine devices (IUDs) do not protect against STIs. To prevent both STIs and pregnancy, always use a condom with another form of birth control. Some STIs, such as herpes, are spread through skin to skin contact. A condom does not protect you from getting such STIs. If you or your partner have herpes and there is an active flare with open sores, avoid all sexual contact. Why are these changes important? Taking steps to practice safe sex protects you and others. Many STIs can be cured. However, some STIs are not curable and will affect you for the rest of your life. STIs can be passed on to another person even if you do not have symptoms. What can happen if changes are not made? Certain STIs may:  Require you to take medicine for the rest of your life.  Affect your ability to have children (your fertility).  Increase your risk for developing another STI or certain serious health conditions, such as: ? Cervical cancer. ? Head and neck cancer. ? Pelvic inflammatory disease (PID) in women. ? Organ damage or damage to other parts of your body, if the infection spreads.  Be passed to a baby during childbirth. How are sexually transmitted infections treated? If you or your partner know or think that you may have an STI:  Talk with your health care provider about what can be done to treat it. Some STIs can be treated and cured with medicines.  For curable STIs, you and   your partner should avoid sex during treatment and for several days after treatment is complete.  You and your partner should both be treated at the same time, if there is any chance that your partner is infected as well. If you get treatment but your partner does not, your partner can re-infect you when you resume sexual contact.  Do not  have unprotected sex. Where to find more information Learn more about sexually transmitted diseases and infections from:  Centers for Disease Control and Prevention: ? More information about specific STIs: www.cdc.gov/std ? Find places to get sexual health counseling and treatment for free or for a low cost: gettested.cdc.gov  U.S. Department of Health and Human Services: www.womenshealth.gov/publications/our-publications/fact-sheet/sexually-transmitted-infections.html Summary  The only way to completely prevent STIs is not to have sex (practice abstinence), including oral, vaginal, or anal sex.  STIs can spread through saliva, semen, blood, vaginal mucus, urine, or sexual contact.  If you do have sex, limit your number of sexual partners and use a barrier protection method every time you have sex.  If you develop an STI, get treated right away and ask your partner to be treated as well. Do not resume having sex until both of you have completed treatment for the STI. This information is not intended to replace advice given to you by your health care provider. Make sure you discuss any questions you have with your health care provider. Document Revised: 01/24/2019 Document Reviewed: 08/27/2016 Elsevier Patient Education  2020 Elsevier Inc.  

## 2020-03-13 NOTE — Progress Notes (Signed)
Subjective:     Jessica Potter is a 24 y.o. female here for a routine exam.G0 LMP none on Depo Provera  Current complaints: No complaints. Pt is sexually active. Males. No condom use. Extended family is from [place].  No relations to the [place].     Gynecologic History No LMP recorded. Patient has had an injection. Contraception: Depo-Provera injections Last Pap: 10/2018. Results were: abnormal- LGSIL Last mammogram: n/a  Obstetric History OB History  Gravida Para Term Preterm AB Living  0 0 0 0 0 0  SAB TAB Ectopic Multiple Live Births  0 0 0 0 0   The following portions of the patient's history were reviewed and updated as appropriate: allergies, current medications, past family history, past medical history, past social history, past surgical history and problem list.  Review of Systems Pertinent items are noted in HPI.    Objective:  BP 110/85   Ht 5\' 3"  (1.6 m)   Wt 113 lb (51.3 kg)   BMI 20.02 kg/m  General Appearance:    Alert, cooperative, no distress, appears stated age  Head:    Normocephalic, without obvious abnormality, atraumatic  Eyes:    conjunctiva/corneas clear, EOM's intact, both eyes  Ears:    Normal external ear canals, both ears  Nose:   Nares normal, septum midline, mucosa normal, no drainage    or sinus tenderness  Throat:   Lips, mucosa, and tongue normal; teeth and gums normal  Neck:   Supple, symmetrical, trachea midline, no adenopathy;    thyroid:  no enlargement/tenderness/nodules  Back:     Symmetric, no curvature, ROM normal, no CVA tenderness  Lungs:     respirations unlabored  Chest Wall:    No tenderness or deformity   Heart:    Regular rate and rhythm  Breast Exam:   not done.    Abdomen:     Soft, non-tender, bowel sounds active all four quadrants,    no masses, no organomegaly  Genitalia:    Normal female without lesion, discharge or tenderness   Uterus small and mobile.   Extremities:   Extremities normal, atraumatic, no cyanosis or edema   Pulses:   2+ and symmetric all extremities  Skin:   Skin color, texture, turgor normal, no rashes or lesions    Assessment:    Healthy female exam.   LGSIL PAP in 2020 STI screening Contraception management- s/p Depo Provera injection today    Plan:     Jessica Potter was seen today for annual exam.  Diagnoses and all orders for this visit:  Well female exam with routine gynecological exam -     Cytology - PAP( [place])  Low grade squamous intraepithelial lesion on cytologic smear of cervix (LGSIL) -     Cytology - PAP( [place])  Routine screening for STI (sexually transmitted infection) -     Cytology - PAP( [place])  Depo Provera 150mg  IM x1  f/u in 1 year or sooner prn   Jessica Potter L. Harraway-Smith, M.D., Kaleen Odea

## 2020-03-15 ENCOUNTER — Ambulatory Visit: Payer: 59

## 2020-03-25 LAB — CYTOLOGY - PAP
Chlamydia: NEGATIVE
Comment: NEGATIVE
Comment: NORMAL
Neisseria Gonorrhea: NEGATIVE

## 2020-05-29 ENCOUNTER — Ambulatory Visit (INDEPENDENT_AMBULATORY_CARE_PROVIDER_SITE_OTHER): Payer: Self-pay

## 2020-05-29 ENCOUNTER — Other Ambulatory Visit: Payer: Self-pay

## 2020-05-29 VITALS — BP 114/76 | HR 96 | Wt 120.0 lb

## 2020-05-29 DIAGNOSIS — Z3042 Encounter for surveillance of injectable contraceptive: Secondary | ICD-10-CM

## 2020-05-29 MED FILL — MEDROXYPROGESTERONE ACETATE: 150 | 84 days supply | Qty: 1 | Fill #2

## 2020-05-29 NOTE — Progress Notes (Addendum)
Patient presents for depo provera.Patient up to date on her annual exam. Armandina Stammer RN   Attestation of Attending Supervision of RN: Evaluation and management procedures were performed by the nurse under my supervision and collaboration.  I have reviewed the nursing note and chart, and I agree with the management and plan.  Carolyn L. Harraway-Smith, M.D., Evern Core

## 2020-07-17 NOTE — Patient Instructions (Addendum)
It was great to see you again today- I will be in touch with your blood work and your chest x-ray If your D dimer is positive we will plan for a CT chest to rule out a blood clot If negative we will get an echocardiogram to check on the structure of your heart  Please let me know if any change or worsening of your symptoms in the meantime!

## 2020-07-17 NOTE — Progress Notes (Addendum)
[facility] [address], [address] [place], [place] [address] [phone]  Date:  07/18/2020   Name:  Jessica Potter   DOB:  [birth year]   MRN:  [id number]  PCP:  Pearline Cables, MD    Chief Complaint: Chest Pain (3 weeks)   History of Present Illness:  Jessica Potter is a 24 y.o. very pleasant female patient who presents with the following:  Generally healthy young woman with history of eczema and allergies, here today with concern of chest pain Last seen by myself in April of this year for physical Flu vaccine- declines COVID-19 vaccine- not done yet, encouraged her to do ASAP  Here today with concern of chest pain and palpitations for about 3 weeks  The pain and palpitations can occur separately or together The pain is located under the sternum or to the right of the sternum Can occur any time of day May last up to about 5 minutes Does not occur every day She did have a similar issue about 3 years ago No N/V, sweating with pain  No wheezing noted   Last episode of chest pain about 4 days ago   No exertional chest pain However, she does note that she has felt a bit more SOB with exertion lately  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 Flexural eczema 02/01/2017  . Seasonal allergic rhinitis 04/14/2016    Past Medical History:  Diagnosis Date  . Eczema   . Vaginal Pap smear, abnormal 2020   LGSIL    Past Surgical History:  Procedure Laterality Date  . NO PAST SURGERIES      Social History   Tobacco Use  . Smoking status: Never Smoker  . Smokeless tobacco: Never Used  Substance Use Topics  . Alcohol use: No  . Drug use: No    Family History  Problem Relation Age of Onset  .  Diabetes Maternal Grandmother     No Known Allergies  Medication list has been reviewed and updated.  Current Outpatient Medications on File Prior to Visit  Medication Sig Dispense Refill  . medroxyPROGESTERone (DEPO-PROVERA) 150 MG/ML injection Inject 1 mL (150 mg total) into the muscle every 3 (three) months. 1 mL 3  . montelukast (SINGULAIR) 10 MG tablet Take 1 tablet (10 mg total) by mouth at bedtime. Use as needed for allergies 30 tablet 5   Current Facility-Administered Medications on File Prior to Visit  Medication Dose Route Frequency Provider Last Rate Last Admin  . medroxyPROGESTERone (DEPO-PROVERA) injection 150 mg  150 mg Intramuscular Q90 days Conan Bowens, MD   150 mg at 05/29/20 1342    Review of Systems:  As per HPI- otherwise negative.   Physical Examination: Vitals:   07/18/20 1101  BP: 118/80  Pulse: (!) 101  Resp: 17  SpO2: 99%   Vitals:   07/18/20 1101  Weight: 119 lb (54 kg)  Height: 5\' 3"  (1.6 m)   Body mass index is 21.08 kg/m. Ideal Body Weight: Weight in (lb) to have BMI = 25: 140.8  GEN: no acute distress.  Petite build, looks well HEENT: Atraumatic, Normocephalic.  Ears and Nose: No external deformity. CV: RRR, No M/G/R. No JVD.  No thrill. No extra heart sounds.  I am not able to reproduce pain by pressing on chest wall PULM: CTA B, no wheezes, crackles, rhonchi. No retractions. No resp. distress. No accessory muscle use. ABD: S, NT, ND, +BS. No rebound. No HSM. EXTR: No c/c/e PSYCH: Normally interactive. Conversant.   EKG: Normal sinus rhythm, normal EKG No old tracings for comparison  Results for orders placed or performed in visit on 07/18/20  CBC  Result Value Ref Range   WBC 7.2 3.8 - 10.8 Thousand/uL   RBC 4.77 3.80 - 5.10 Million/uL   Hemoglobin 14.3 11.7 - 15.5 g/dL   HCT 09.6 35 - 45 %   MCV 91.2 80.0 - 100.0 fL   MCH 30.0 27.0 - 33.0 pg   MCHC 32.9 32.0 - 36.0 g/dL   RDW 28.3 66.2 - 94.7 %   Platelets 306 140 - 400  Thousand/uL   MPV 10.4 7.5 - 12.5 fL  Basic metabolic panel  Result Value Ref Range   Glucose, Bld 93 65 - 99 mg/dL   BUN 10 7 - 25 mg/dL   Creat 6.54 6.50 - 3.54 mg/dL   BUN/Creatinine Ratio NOT APPLICABLE 6 - 22 (calc)   Sodium 139 135 - 146 mmol/L   Potassium 3.7 3.5 - 5.3 mmol/L   Chloride 105 98 - 110 mmol/L   CO2 23 20 - 32 mmol/L   Calcium 9.9 8.6 - 10.2 mg/dL  TSH  Result Value Ref Range   TSH 1.58 mIU/L  D-Dimer, Quantitative  Result Value Ref Range   D-Dimer, Quant <0.19 <0.50 mcg/mL FEU  POCT urine pregnancy  Result Value Ref Range   Preg Test, Ur Negative Negative    Assessment and Plan: Chest pain, unspecified type - Plan: EKG 12-Lead, CBC, Basic metabolic panel, D-Dimer, Quantitative, POCT urine pregnancy, DG Chest 2 View  Palpitations - Plan: TSH   Generally healthy young woman here today with concern of atypical chest pain and palpitations EKG today is reassuring Obtain chest film Pulmonary embolism will be more of a concern than CAD in this patient.  Await D-dimer and will follow up with her-if positive plan for CT angiogram.  If negative, may proceed to echocardiogram or Zio patch if symptoms continue This visit occurred during the SARS-CoV-2 public health emergency.  Safety protocols were in place, including screening questions prior to the visit, additional usage of staff PPE, and extensive cleaning of exam room while observing appropriate contact time as indicated for disinfecting solutions.    Signed Abbe Amsterdam, MD  Received her chest mass below, message to patient DG Chest 2 View  Result Date: 07/18/2020 CLINICAL DATA:  Chest pain EXAM: CHEST - 2 VIEW COMPARISON:  None. FINDINGS: The heart size and mediastinal contours are within normal limits. Both lungs are clear. No visible pleural effusions or pneumothorax. The visualized skeletal structures are unremarkable. IMPRESSION: No acute cardiopulmonary disease. Electronically Signed   By: Feliberto Harts MD   On: 07/18/2020 11:57   Received her labs as below, message to patient Results for orders placed or performed in visit on 07/18/20  CBC  Result Value Ref Range   WBC 7.2 3.8 - 10.8 Thousand/uL   RBC 4.77 3.80 - 5.10 Million/uL   Hemoglobin 14.3 11.7 - 15.5 g/dL   HCT 65.6 35 - 45 %   MCV 91.2 80.0 - 100.0 fL   MCH 30.0 27.0 - 33.0 pg   MCHC 32.9 32.0 - 36.0 g/dL   RDW 11.8  11.0 - 15.0 %   Platelets 306 140 - 400 Thousand/uL   MPV 10.4 7.5 - 12.5 fL  Basic metabolic panel  Result Value Ref Range   Glucose, Bld 93 65 - 99 mg/dL   BUN 10 7 - 25 mg/dL   Creat 2.12 2.48 - 2.50 mg/dL   BUN/Creatinine Ratio NOT APPLICABLE 6 - 22 (calc)   Sodium 139 135 - 146 mmol/L   Potassium 3.7 3.5 - 5.3 mmol/L   Chloride 105 98 - 110 mmol/L   CO2 23 20 - 32 mmol/L   Calcium 9.9 8.6 - 10.2 mg/dL  TSH  Result Value Ref Range   TSH 1.58 mIU/L  D-Dimer, Quantitative  Result Value Ref Range   D-Dimer, Quant <0.19 <0.50 mcg/mL FEU  POCT urine pregnancy  Result Value Ref Range   Preg Test, Ur Negative Negative    Addendum 11/5, received the rest of her lab results.  She would like to proceed with echo Update patient, order echocardiogram

## 2020-07-18 ENCOUNTER — Ambulatory Visit (HOSPITAL_BASED_OUTPATIENT_CLINIC_OR_DEPARTMENT_OTHER)
Admission: RE | Admit: 2020-07-18 | Discharge: 2020-07-18 | Disposition: A | Discharge status: Home / Self Care | Payer: 59 | Source: Ambulatory Visit | Attending: Family Medicine | Admitting: Family Medicine

## 2020-07-18 ENCOUNTER — Encounter: Payer: Self-pay | Admitting: Family Medicine

## 2020-07-18 ENCOUNTER — Other Ambulatory Visit: Payer: Self-pay

## 2020-07-18 ENCOUNTER — Ambulatory Visit (INDEPENDENT_AMBULATORY_CARE_PROVIDER_SITE_OTHER): Payer: 59 | Admitting: Family Medicine

## 2020-07-18 VITALS — BP 118/80 | HR 101 | Resp 17 | Ht 63.0 in | Wt 119.0 lb

## 2020-07-18 DIAGNOSIS — R079 Chest pain, unspecified: Secondary | ICD-10-CM | POA: Diagnosis not present

## 2020-07-18 DIAGNOSIS — R002 Palpitations: Secondary | ICD-10-CM

## 2020-07-18 LAB — POCT URINE PREGNANCY: Preg Test, Ur: NEGATIVE

## 2020-07-19 ENCOUNTER — Encounter: Payer: Self-pay | Admitting: Family Medicine

## 2020-07-19 LAB — D-DIMER, QUANTITATIVE: D-Dimer, Quant: <0.19 mcg/mL FEU (ref <0.50)

## 2020-07-19 LAB — BASIC METABOLIC PANEL
BUN: 10 mg/dL (ref 7–25)
CO2: 23 mmol/L (ref 20–32)
Calcium: 9.9 mg/dL (ref 8.6–10.2)
Chloride: 105 mmol/L (ref 98–110)
Creat: 0.76 mg/dL (ref 0.50–1.10)
Glucose, Bld: 93 mg/dL (ref 65–99)
Potassium: 3.7 mmol/L (ref 3.5–5.3)
Sodium: 139 mmol/L (ref 135–146)

## 2020-07-19 LAB — CBC
HCT: 43.5 % (ref 35.0–45.0)
Hemoglobin: 14.3 g/dL (ref 11.7–15.5)
MCH: 30 pg (ref 27.0–33.0)
MCHC: 32.9 g/dL (ref 32.0–36.0)
MCV: 91.2 fL (ref 80.0–100.0)
MPV: 10.4 fL (ref 7.5–12.5)
Platelets: 306 10*3/uL (ref 140–400)
RBC: 4.77 10*6/uL (ref 3.80–5.10)
RDW: 11.8 % (ref 11.0–15.0)
WBC: 7.2 10*3/uL (ref 3.8–10.8)

## 2020-07-19 LAB — TSH: TSH: 1.58 mIU/L

## 2020-07-19 NOTE — Addendum Note (Signed)
Addended by: Abbe Amsterdam C on: 07/19/2020 07:41 AM   Modules accepted: Orders

## 2020-08-21 ENCOUNTER — Ambulatory Visit (INDEPENDENT_AMBULATORY_CARE_PROVIDER_SITE_OTHER): Payer: 59

## 2020-08-21 ENCOUNTER — Other Ambulatory Visit: Payer: Self-pay

## 2020-08-21 VITALS — BP 108/70 | HR 93 | Wt 121.0 lb

## 2020-08-21 DIAGNOSIS — Z3042 Encounter for surveillance of injectable contraceptive: Secondary | ICD-10-CM

## 2020-08-21 MED ORDER — MEDROXYPROGESTERONE ACETATE 150 MG/ML IM SUSP
150.0000 mg | Freq: Once | INTRAMUSCULAR | Status: AC
  Administered 2020-08-21: 150 mg via INTRAMUSCULAR

## 2020-08-21 MED FILL — medroxyPROGESTERone ACETATE: 150 | 84 days supply | Qty: 1 | Fill #3

## 2020-08-21 NOTE — Progress Notes (Signed)
Jessica Potter here for Depo-Provera  Injection.  Injection administered without complication. Patient will return in 3 months for next injection.  Kissie Ziolkowski l Shaunte Tuft, CMA 08/21/2020  1:28 PM

## 2020-08-21 NOTE — Progress Notes (Signed)
Chart reviewed - agree with CMA/RN documentation.  ° °

## 2020-08-23 ENCOUNTER — Other Ambulatory Visit: Payer: Self-pay

## 2020-08-23 ENCOUNTER — Ambulatory Visit (HOSPITAL_COMMUNITY): Payer: 59 | Attending: Cardiology

## 2020-08-23 DIAGNOSIS — R079 Chest pain, unspecified: Secondary | ICD-10-CM

## 2020-08-23 LAB — ECHOCARDIOGRAM COMPLETE
Area-P 1/2: 3.24 cm2
S' Lateral: 1.8 cm

## 2020-08-24 ENCOUNTER — Encounter: Payer: Self-pay | Admitting: Family Medicine

## 2020-08-24 DIAGNOSIS — R002 Palpitations: Secondary | ICD-10-CM

## 2020-09-02 ENCOUNTER — Other Ambulatory Visit: Payer: Self-pay

## 2020-09-02 DIAGNOSIS — L309 Dermatitis, unspecified: Secondary | ICD-10-CM | POA: Insufficient documentation

## 2020-09-03 ENCOUNTER — Ambulatory Visit (INDEPENDENT_AMBULATORY_CARE_PROVIDER_SITE_OTHER): Payer: 59

## 2020-09-03 ENCOUNTER — Encounter: Payer: Self-pay | Admitting: Cardiology

## 2020-09-03 ENCOUNTER — Other Ambulatory Visit: Payer: Self-pay

## 2020-09-03 ENCOUNTER — Ambulatory Visit (INDEPENDENT_AMBULATORY_CARE_PROVIDER_SITE_OTHER): Payer: 59 | Admitting: Cardiology

## 2020-09-03 VITALS — BP 110/72 | HR 110 | Ht 63.0 in | Wt 122.0 lb

## 2020-09-03 DIAGNOSIS — R002 Palpitations: Secondary | ICD-10-CM

## 2020-09-03 DIAGNOSIS — R079 Chest pain, unspecified: Secondary | ICD-10-CM

## 2020-09-03 DIAGNOSIS — R0602 Shortness of breath: Secondary | ICD-10-CM | POA: Diagnosis not present

## 2020-09-03 NOTE — Progress Notes (Signed)
Cardiology Office Note:    Date:  09/04/2020   ID:  Jessica Potter, DOB 01-16-1996, MRN [id number]  PCP:  Pearline Cablesopland, Jessica C, MD  Cardiologist:  Thomasene RippleKardie Paulita Licklider, DO  Electrophysiologist:  None   Referring MD: Pearline Cablesopland, Jessica C, MD   " I have chest pain and sometimes a few my heart beating fast"  History of Present Illness:    Jessica Potter is a 24 y.o. female with a hx of eczema tells me that she has been experiencing intermittent chest dullness.  She notes that is in her midsternal sometimes starts from left to right symptoms recollect.  She states she only notices when she is dehydrated but is becoming more frequent.  She tells me there is associated shortness of breath.  Was most bothersome is the fact that the patient reports that she has had remittent palpitations.  She described as abrupt onset of fast heartbeat which goes for a few minutes and then resolve itself.  Nothing makes it better or worse.  She is concerned about this.  Past Medical History:  Diagnosis Date  . Eczema   . Flexural eczema 02/01/2017  . Seasonal allergic rhinitis 04/14/2016  . Vaginal Pap smear, abnormal 2020   LGSIL    Past Surgical History:  Procedure Laterality Date  . NO PAST SURGERIES      Current Medications: Current Meds  Medication Sig  . medroxyPROGESTERone (DEPO-PROVERA) 150 MG/ML injection Inject 1 mL (150 mg total) into the muscle every 3 (three) months.  . medroxyPROGESTERone Acetate 150 MG/ML SUSY Inject 1 mL into the muscle every 3 (three) months.  . montelukast (SINGULAIR) 10 MG tablet Take 1 tablet (10 mg total) by mouth at bedtime. Use as needed for allergies   Current Facility-Administered Medications for the 09/03/20 encounter (Office Visit) with Thomasene Rippleobb, Enes Rokosz, DO  Medication  . medroxyPROGESTERone (DEPO-PROVERA) injection 150 mg     Allergies:   Patient has no known allergies.   Social History   Socioeconomic History  . Marital status: Single    Spouse name: Not on file  .  Number of children: Not on file  . Years of education: Not on file  . Highest education level: Not on file  Occupational History  . Not on file  Tobacco Use  . Smoking status: Never Smoker  . Smokeless tobacco: Never Used  Substance and Sexual Activity  . Alcohol use: No  . Drug use: No  . Sexual activity: Yes    Birth control/protection: Injection  Other Topics Concern  . Not on file  Social History Narrative  . Not on file   Social Determinants of Health   Financial Resource Strain: Not on file  Food Insecurity: Not on file  Transportation Needs: Not on file  Physical Activity: Not on file  Stress: Not on file  Social Connections: Not on file     Family History: The patient's family history includes Diabetes in her maternal grandmother.  ROS:   Review of Systems  Constitution: Negative for decreased appetite, fever and weight gain.  HENT: Negative for congestion, ear discharge, hoarse voice and sore throat.   Eyes: Negative for discharge, redness, vision loss in right eye and visual halos.  Cardiovascular: Negative for chest pain, dyspnea on exertion, leg swelling, orthopnea and palpitations.  Respiratory: Negative for cough, hemoptysis, shortness of breath and snoring.   Endocrine: Negative for heat intolerance and polyphagia.  Hematologic/Lymphatic: Negative for bleeding problem. Does not bruise/bleed easily.  Skin: Negative for flushing, nail changes,  rash and suspicious lesions.  Musculoskeletal: Negative for arthritis, joint pain, muscle cramps, myalgias, neck pain and stiffness.  Gastrointestinal: Negative for abdominal pain, bowel incontinence, diarrhea and excessive appetite.  Genitourinary: Negative for decreased libido, genital sores and incomplete emptying.  Neurological: Negative for brief paralysis, focal weakness, headaches and loss of balance.  Psychiatric/Behavioral: Negative for altered mental status, depression and suicidal ideas.   Allergic/Immunologic: Negative for HIV exposure and persistent infections.    EKGs/Labs/Other Studies Reviewed:    The following studies were reviewed today:   EKG:  The ekg ordered today demonstrates sinus tachycardia, heart rate 110 bpm.  TTE IMPRESSIONS  1. Left ventricular ejection fraction, by estimation, is 60 to 65%. The left ventricle has normal function. The left ventricle has no regional wall motion abnormalities. Left ventricular diastolic parameters were normal.  2. Right ventricular systolic function is normal. he right ventricular size is normal. There is normal pulmonary artery systolic pressure. The  estimated right ventricular systolic pressure is 19.0 mmHg.  3. The mitral valve is normal in structure. No evidence of mitral valve regurgitation. No evidence of mitral stenosis.  4. The aortic valve is normal in structure. Aortic valve regurgitation is not visualized. No aortic stenosis is present.  5. The inferior vena cava is normal in size with greater than 50% respiratory variability, suggesting right atrial pressure of 3 mmHg.  6. Possible anomalous Left main coronary artery off the non coronary cusp. Recommend coronary CTA for further assessment.   Recent Labs: 01/08/2020: ALT 13 07/18/2020: BUN 10; Creat 0.76; Hemoglobin 14.3; Platelets 306; Potassium 3.7; Sodium 139; TSH 1.58  Recent Lipid Panel    Component Value Date/Time   CHOL 166 01/08/2020 1111   TRIG 56.0 01/08/2020 1111   HDL 43.80 01/08/2020 1111   CHOLHDL 4 01/08/2020 1111   VLDL 11.2 01/08/2020 1111   LDLCALC 111 (H) 01/08/2020 1111    Physical Exam:    VS:  BP 110/72 (BP Location: Right Arm)   Pulse (!) 110   Ht 5\' 3"  (1.6 m)   Wt 122 lb (55.3 kg)   SpO2 98%   BMI 21.61 kg/m     Wt Readings from Last 3 Encounters:  09/03/20 122 lb (55.3 kg)  08/21/20 121 lb (54.9 kg)  07/18/20 119 lb (54 kg)     GEN: Well nourished, well developed in no acute distress HEENT: Normal NECK: No  JVD; No carotid bruits LYMPHATICS: No lymphadenopathy CARDIAC: S1S2 noted,RRR, no murmurs, rubs, gallops RESPIRATORY:  Clear to auscultation without rales, wheezing or rhonchi  ABDOMEN: Soft, non-tender, non-distended, +bowel sounds, no guarding. EXTREMITIES: No edema, No cyanosis, no clubbing MUSCULOSKELETAL:  No deformity  SKIN: Warm and dry NEUROLOGIC:  Alert and oriented x 3, non-focal PSYCHIATRIC:  Normal affect, good insight  ASSESSMENT:    1. Chest pain of uncertain etiology   2. Shortness of breath   3. Palpitations    PLAN:    Her echocardiogram which was done recently show evidence of a normal ejection fraction however there is question that she may have anomalous coronary artery.  With this information and her chest pain I like to get a coronary CTA to assess the patient coronary anatomy.  I discussed this with the patient she is agreeable to proceed with this testing. I would like to rule out a cardiovascular etiology of this palpitation, therefore at this time I would like to placed a zio patch for  14 days.  I encouraged the patient to increase her  hydration as well.  We talked about potential diet subclinical anxiety may be playing a role but will continue to monitor here.   The patient is in agreement with the above plan. The patient left the office in stable condition.  The patient will follow up in 3 months or sooner if needed.   Medication Adjustments/Labs and Tests Ordered: Current medicines are reviewed at length with the patient today.  Concerns regarding medicines are outlined above.  Orders Placed This Encounter  Procedures  . LONG TERM MONITOR (3-14 DAYS)  . EKG 12-Lead   No orders of the defined types were placed in this encounter.   Patient Instructions  Medication Instructions: Your physician recommends that you continue on your current medications as directed. Please refer to the Current Medication list given to you today.  Lab Work: NONE If you  have labs (blood work) drawn today and your tests are completely normal, you will receive your results only by: Marland Kitchen MyChart Message (if you have MyChart) OR . A paper copy in the mail If you have any lab test that is abnormal or we need to change your treatment, we will call you to review the results.   Testing/Procedures: 14 Day Monitor   Follow-Up: At [facility], you and your health needs are our priority.  As part of our continuing mission to provide you with exceptional heart care, we have created designated Provider Care Teams.  These Care Teams include your primary Cardiologist (physician) and Advanced Practice Providers (APPs -  Physician Assistants and Nurse Practitioners) who all work together to provide you with the care you need, when you need it.  We recommend signing up for the patient portal called "MyChart".  Sign up information is provided on this After Visit Summary.  MyChart is used to connect with patients for Virtual Visits (Telemedicine).  Patients are able to view lab/test results, encounter notes, upcoming appointments, etc.  Non-urgent messages can be sent to your provider as well.   To learn more about what you can do with MyChart, go to [email or web address].    Your next appointment:   3 month(s)  The format for your next appointment:   In Person  Provider:   Thomasene Ripple, DO   Other Instructions      Adopting a Healthy Lifestyle.  Know what a healthy weight is for you (roughly BMI <25) and aim to maintain this   Aim for 7+ servings of fruits and vegetables daily   65-80+ fluid ounces of water or unsweet tea for healthy kidneys   Limit to max 1 drink of alcohol per day; avoid smoking/tobacco   Limit animal fats in diet for cholesterol and heart health - choose grass fed whenever available   Avoid highly processed foods, and foods high in saturated/trans fats   Aim for low stress - take time to unwind and care for your mental health   Aim  for 150 min of moderate intensity exercise weekly for heart health, and weights twice weekly for bone health   Aim for 7-9 hours of sleep daily   When it comes to diets, agreement about the perfect plan isnt easy to find, even among the experts. Experts at the [employer] [facility] of [employer] developed an idea known as the Healthy Eating Plate. Just imagine a plate divided into logical, healthy portions.   The emphasis is on diet quality:   Load up on vegetables and fruits - one-half of your plate: Aim for color and variety,  and remember that potatoes dont count.   Go for whole grains - one-quarter of your plate: Whole wheat, barley, wheat berries, quinoa, oats, brown rice, and foods made with them. If you want pasta, go with whole wheat pasta.   Protein power - one-quarter of your plate: Fish, chicken, beans, and nuts are all healthy, versatile protein sources. Limit red meat.   The diet, however, does go beyond the plate, offering a few other suggestions.   Use healthy plant oils, such as olive, canola, soy, corn, sunflower and peanut. Check the labels, and avoid partially hydrogenated oil, which have unhealthy trans fats.   If youre thirsty, drink water. Coffee and tea are good in moderation, but skip sugary drinks and limit milk and dairy products to one or two daily servings.   The type of carbohydrate in the diet is more important than the amount. Some sources of carbohydrates, such as vegetables, fruits, whole grains, and beans-are healthier than others.   Finally, stay active  Signed, Thomasene Ripple, DO  09/04/2020 9:07 AM    [facility]

## 2020-09-03 NOTE — Patient Instructions (Signed)
Medication Instructions: Your physician recommends that you continue on your current medications as directed. Please refer to the Current Medication list given to you today.  Lab Work: NONE If you have labs (blood work) drawn today and your tests are completely normal, you will receive your results only by:  MyChart Message (if you have MyChart) OR  A paper copy in the mail If you have any lab test that is abnormal or we need to change your treatment, we will call you to review the results.   Testing/Procedures: 14 Day Monitor   Follow-Up: At [facility], you and your health needs are our priority.  As part of our continuing mission to provide you with exceptional heart care, we have created designated Provider Care Teams.  These Care Teams include your primary Cardiologist (physician) and Advanced Practice Providers (APPs -  Physician Assistants and Nurse Practitioners) who all work together to provide you with the care you need, when you need it.  We recommend signing up for the patient portal called "MyChart".  Sign up information is provided on this After Visit Summary.  MyChart is used to connect with patients for Virtual Visits (Telemedicine).  Patients are able to view lab/test results, encounter notes, upcoming appointments, etc.  Non-urgent messages can be sent to your provider as well.   To learn more about what you can do with MyChart, go to [email or web address].    Your next appointment:   3 month(s)  The format for your next appointment:   In Person  Provider:   Thomasene Ripple, DO   Other Instructions

## 2020-09-04 ENCOUNTER — Telehealth: Payer: Self-pay

## 2020-09-04 ENCOUNTER — Other Ambulatory Visit: Payer: Self-pay | Admitting: Cardiology

## 2020-09-04 DIAGNOSIS — R931 Abnormal findings on diagnostic imaging of heart and coronary circulation: Secondary | ICD-10-CM

## 2020-09-04 DIAGNOSIS — R072 Precordial pain: Secondary | ICD-10-CM

## 2020-09-04 MED ORDER — METOPROLOL TARTRATE 100 MG PO TABS: 100.0000 mg | ORAL_TABLET | Freq: Once | ORAL | 0 refills | Status: DC

## 2020-09-04 MED FILL — METOPROLOL TARTRATE 100 MG: 100 | 1 days supply | Qty: 1 | Fill #0

## 2020-09-04 NOTE — Telephone Encounter (Signed)
Called and spoke with pt who is aware that the CT has been ordered and instructions printed and mailed as well as discussed on the phone. Pt verbalized understanding and had no additional questions.  Your cardiac CT will be scheduled at:   [facility] [address] [place], [place] [address] [phone]   [facility], please arrive at the [facility] main entrance of [facility] 30 minutes prior to test start time. Proceed to the [facility] Radiology Department (first floor) to check-in and test prep.   Please follow these instructions carefully (unless otherwise directed):   On the Night Before the Test: . Be sure to Drink plenty of water. . Do not consume any caffeinated/decaffeinated beverages or chocolate 12 hours prior to your test. . Do not take any antihistamines 12 hours prior to your test.  On the Day of the Test: . Drink plenty of water. Do not drink any water within one hour of the test. . Do not eat any food 4 hours prior to the test. . You may take your regular medications prior to the test.  . Take metoprolol (Lopressor) two hours prior to test. . FEMALES- please wear underwire-free bra if available      After the Test: . Drink plenty of water. . After receiving IV contrast, you may experience a mild flushed feeling. This is normal. . On occasion, you may experience a mild rash up to 24 hours after the test. This is not dangerous. If this occurs, you can take Benadryl 25 mg and increase your fluid intake. . If you experience trouble breathing, this can be serious. If it is severe call 911 IMMEDIATELY. If it is mild, please call our office.    Once we have confirmed authorization from your insurance company, we will call you to set up a date and time for your test. Based on how quickly your insurance processes prior authorizations requests, please allow up to 4 weeks to be contacted for scheduling your Cardiac CT appointment. Be  advised that routine Cardiac CT appointments could be scheduled as many as 8 weeks after your provider has ordered it.  For non-scheduling related questions, please contact the cardiac imaging nurse navigator should you have any questions/concerns: Marchia Bond, Cardiac Imaging Nurse Navigator Burley Saver, Interim Cardiac Imaging Nurse [place] [facility] Services Direct Office Dial: [phone]   For scheduling needs, including cancellations and rescheduling, please call [place], [phone].

## 2020-09-17 ENCOUNTER — Telehealth (HOSPITAL_COMMUNITY): Payer: Self-pay | Admitting: *Deleted

## 2020-09-17 MED FILL — METOPROLOL TARTRATE 100 MG: 100 | 1 days supply | Qty: 1 | Fill #0

## 2020-09-17 NOTE — Telephone Encounter (Signed)
Reaching out to patient to offer assistance regarding upcoming cardiac imaging study; pt verbalizes understanding of appt date/time, parking situation and where to check in, pre-test NPO status and medications ordered, and verified current allergies; name and call back number provided for further questions should they arise  Nasya Vincent RN Navigator Cardiac Imaging  Heart and Vascular [phone] office [phone] cell  

## 2020-09-19 ENCOUNTER — Other Ambulatory Visit: Payer: Self-pay

## 2020-09-19 ENCOUNTER — Ambulatory Visit (HOSPITAL_COMMUNITY)
Admission: RE | Admit: 2020-09-19 | Discharge: 2020-09-19 | Disposition: A | Discharge status: Home / Self Care | Payer: 59 | Source: Ambulatory Visit | Attending: Cardiology | Admitting: Cardiology

## 2020-09-19 DIAGNOSIS — R931 Abnormal findings on diagnostic imaging of heart and coronary circulation: Secondary | ICD-10-CM | POA: Insufficient documentation

## 2020-09-19 DIAGNOSIS — R072 Precordial pain: Secondary | ICD-10-CM

## 2020-09-19 MED ORDER — NITROGLYCERIN 0.4 MG SL SUBL
SUBLINGUAL_TABLET | SUBLINGUAL | Status: AC
  Filled 2020-09-19: qty 1

## 2020-09-19 MED ORDER — IOHEXOL 350 MG/ML SOLN
80.0000 mL | Freq: Once | INTRAVENOUS | Status: AC | PRN
  Administered 2020-09-19: 80 mL via INTRAVENOUS

## 2020-09-19 MED ORDER — NITROGLYCERIN 0.4 MG SL SUBL
0.4000 mg | SUBLINGUAL_TABLET | Freq: Once | SUBLINGUAL | Status: AC
  Administered 2020-09-19: 0.4 mg via SUBLINGUAL

## 2020-09-20 ENCOUNTER — Telehealth: Payer: Self-pay

## 2020-09-20 NOTE — Telephone Encounter (Signed)
Left a message to return my call.

## 2020-09-20 NOTE — Telephone Encounter (Signed)
-----   Message from Kardie Tobb, DO sent at 09/20/2020 12:19 PM EST ----- Please have the patient come in to see me the week I am in [place] because I like to discuss her CT results with her. 

## 2020-09-23 ENCOUNTER — Telehealth: Payer: Self-pay

## 2020-09-23 NOTE — Telephone Encounter (Signed)
Patient returning call.

## 2020-09-23 NOTE — Telephone Encounter (Signed)
Spoke with patient regarding results and recommendation.  Patient verbalizes understanding and is agreeable to plan of care. Advised patient to call back with any issues or concerns.  

## 2020-09-23 NOTE — Telephone Encounter (Signed)
-----   Message from Thomasene Ripple, DO sent at 09/20/2020 12:19 PM EST ----- Please have the patient come in to see me the week I am in [facility] because I like to discuss her CT results with her.

## 2020-09-23 NOTE — Telephone Encounter (Signed)
Left message on patients voicemail to please return our call.   

## 2020-09-30 ENCOUNTER — Ambulatory Visit: Payer: 59 | Admitting: Cardiology

## 2020-09-30 ENCOUNTER — Telehealth: Payer: 59 | Admitting: Cardiology

## 2020-10-03 ENCOUNTER — Encounter: Payer: Self-pay | Admitting: Cardiology

## 2020-10-03 ENCOUNTER — Telehealth (INDEPENDENT_AMBULATORY_CARE_PROVIDER_SITE_OTHER): Payer: 59 | Admitting: Cardiology

## 2020-10-03 VITALS — Ht 63.0 in | Wt 122.0 lb

## 2020-10-03 DIAGNOSIS — Q245 Malformation of coronary vessels: Secondary | ICD-10-CM | POA: Diagnosis not present

## 2020-10-03 DIAGNOSIS — R072 Precordial pain: Secondary | ICD-10-CM | POA: Diagnosis not present

## 2020-10-03 NOTE — Progress Notes (Signed)
Telehealth video visit  Date:  10/03/2020   ID:  Jessica Potter, DOB 11/01/1995, MRN [id number]  The patient is at home. I am in the office  PCP:  Copland, Gwenlyn Found, MD  Cardiologist:  Thomasene Ripple, DO  Electrophysiologist:  None   Evaluation Performed: Video visit for follow-up  Chief Complaint: I am still having chest pain  History of Present Illness:    Jessica Potter is a 25 y.o. female with a hx of eczema initially presented on September 03, 2020 to be evaluated for chest pain.  At that time I recommend that she get a coronary CTA due to her symptoms as well as suspicion for anomalous origin of her left main artery.  She also had palpitations so monitor was placed on the patient.   In the interim she tells me that he has had same chest pain on exertion.  She described it as similar pain dull intermittent in nature midsternal and sometimes it starts from left to right and sometimes is hard to discern where the pain starts from.  There can be shortness of breath at times but not always.  In the interim the patient was able to get her coronary CTA done and she is here today for me to discuss these results with her.  The patient does not} have symptoms concerning for COVID-19 infection .  Past Medical History:  Diagnosis Date  . Eczema   . Flexural eczema 02/01/2017  . Seasonal allergic rhinitis 04/14/2016  . Vaginal Pap smear, abnormal 2020   LGSIL   Past Surgical History:  Procedure Laterality Date  . NO PAST SURGERIES       Current Meds  Medication Sig  . medroxyPROGESTERone (DEPO-PROVERA) 150 MG/ML injection Inject 1 mL (150 mg total) into the muscle every 3 (three) months.  . montelukast (SINGULAIR) 10 MG tablet Take 1 tablet (10 mg total) by mouth at bedtime. Use as needed for allergies   Current Facility-Administered Medications for the 10/03/20 encounter (Video Visit) with Thomasene Ripple, DO  Medication  . medroxyPROGESTERone (DEPO-PROVERA) injection 150 mg      Allergies:   Patient has no known allergies.   Social History   Tobacco Use  . Smoking status: Never Smoker  . Smokeless tobacco: Never Used  Substance Use Topics  . Alcohol use: No  . Drug use: No     Family Hx: The patient's family history includes Diabetes in her maternal grandmother.  ROS:   Review of Systems  Constitution: Negative for decreased appetite, fever and weight gain.  HENT: Negative for congestion, ear discharge, hoarse voice and sore throat.   Eyes: Negative for discharge, redness, vision loss in right eye and visual halos.  Cardiovascular: Reports chest pain.  Negative for dyspnea on exertion, leg swelling, orthopnea and palpitations.  Respiratory: Negative for cough, hemoptysis, shortness of breath and snoring.   Endocrine: Negative for heat intolerance and polyphagia.  Hematologic/Lymphatic: Negative for bleeding problem. Does not bruise/bleed easily.  Skin: Negative for flushing, nail changes, rash and suspicious lesions.  Musculoskeletal: Negative for arthritis, joint pain, muscle cramps, myalgias, neck pain and stiffness.  Gastrointestinal: Negative for abdominal pain, bowel incontinence, diarrhea and excessive appetite.  Genitourinary: Negative for decreased libido, genital sores and incomplete emptying.  Neurological: Negative for brief paralysis, focal weakness, headaches and loss of balance.  Psychiatric/Behavioral: Negative for altered mental status, depression and suicidal ideas.  Allergic/Immunologic: Negative for HIV exposure and persistent infections.     Prior CV studies:   The  following studies were reviewed today:  Zio monitor    The patient wore the monitor for 14 days starting September 03, 2020. Indication: Shortness of breath   The minimum heart rate was 59 bpm, maximum heart rate was 158 bpm, and average heart rate was 103 bpm. Predominant underlying rhythm was Sinus Rhythm.     Premature atrial complexes were rare less than  1%. Premature Ventricular complexes were rare less than 1%.   No ventricular tachycardia, no pauses, No AV block, no supraventricular tachycardia and no atrial fibrillation present.   9 patient triggered events and 7 diary events all associated with sinus tachycardia    Conclusion: Normal/unremarkable study with no significant arrhythmia.   CCTA Impression  Aorta: Normal size.  No calcifications.  No dissection.   Aortic Valve:  Trileaflet.  No calcifications.   Coronary Arteries:  Normal coronary origin.  Right dominance.   RCA is a large dominant artery that gives rise to PDA and PLVB. There is no plaque.   Left main is a large artery. 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   LAD is a large vessel that has no plaque.   LCX is a non-dominant artery that gives rise to one large OM1 branch. There is no plaque.   Other findings:   Normal pulmonary vein drainage into the left atrium.   Normal let atrial appendage without a thrombus.   Normal size of the pulmonary artery.   IMPRESSION: 1. Coronary calcium score of 0. This was 0 percentile for age and sex matched control.   2. Normal coronary origin with right dominance.   3. 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   4. No evidence of CAD.  TTE Impression IMPRESSIONS   1. Left ventricular ejection fraction, by estimation, is 60 to 65%. The  left ventricle has normal function. The left ventricle has no regional  wall motion abnormalities. Left ventricular diastolic parameters were  normal.   2. Right ventricular systolic function is normal. The right ventricular  size is normal. There is normal  pulmonary artery systolic pressure. The  estimated right ventricular systolic pressure is 19.0 mmHg.   3. The mitral valve is normal in structure. No evidence of mitral valve  regurgitation. No evidence of mitral stenosis.   4. The aortic valve is normal in structure. Aortic valve regurgitation is  not visualized. No aortic stenosis is present.   5. The inferior vena cava is normal in size with greater than 50%  respiratory variability, suggesting right atrial pressure of 3 mmHg.   6. Possible anomalous Left main coronary artery off the non coronary  cusp. Recommend coronary CTA for further assessment.     Labs/Other Tests and Data Reviewed:    EKG: None today  Recent Labs: 01/08/2020: ALT 13 07/18/2020: BUN 10; Creat 0.76; Hemoglobin 14.3; Platelets 306; Potassium 3.7; Sodium 139; TSH 1.58   Recent Lipid Panel Lab Results  Component Value Date/Time   CHOL 166 01/08/2020 11:11 AM   TRIG 56.0 01/08/2020 11:11 AM   HDL 43.80 01/08/2020 11:11 AM   CHOLHDL 4 01/08/2020 11:11 AM   LDLCALC 111 (H) 01/08/2020 11:11  AM    Wt Readings from Last 3 Encounters:  10/03/20 122 lb (55.3 kg)  09/03/20 122 lb (55.3 kg)  08/21/20 121 lb (54.9 kg)     Objective:    Vital Signs:  Ht 5\' 3"  (1.6 m)   Wt 122 lb (55.3 kg)   BMI 21.61 kg/m    Unable to perform physical exam  ASSESSMENT & PLAN:    1. 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  Her CT scan does show concerning for a high risk feature of her coronary anomaly.  Her left main comes off anteriorly with the short run between the pulmonary artery and the aorta.  With her recurrent symptoms of chest pain, I like to stress the patient using a stress echocardiogram to see if after exercise if there is any significant ischemia.  In addition it would be beneficial for her to see CT surgery for any initial evaluations-shared decision evaluation referral for CT surgery pending her  stress echo.  Thankfully she has not had any syncope episodes and there is no arrhythmias on her monitor that was worn recently.  She is in agreement with the plan.  All of her questions today were answered.  Her boyfriend did listening on her video visit and the patient was okay with this during this time.  COVID-19 Education: The signs and symptoms of COVID-19 were discussed with the patient and how to seek care for testing (follow up with PCP or arrange E-visit). The importance of social distancing was discussed today.  Time:   Today, I have spent  with the patient with telehealth technology discussing the above problems.     Medication Adjustments/Labs and Tests Ordered: Current medicines are reviewed at length with the patient today.  Concerns regarding medicines are outlined above.   Tests Ordered: No orders of the defined types were placed in this encounter.   Medication Changes: No orders of the defined types were placed in this encounter.   Follow Up: 1 week after her stress echocardiogram  , DO  10/03/2020 12:03 PM    [facility]

## 2020-10-03 NOTE — Patient Instructions (Signed)
Medication Instructions:  Your physician recommends that you continue on your current medications as directed. Please refer to the Current Medication list given to you today.  *If you need a refill on your cardiac medications before your next appointment, please call your pharmacy*   Lab Work: None If you have labs (blood work) drawn today and your tests are completely normal, you will receive your results only by: Marland Kitchen MyChart Message (if you have MyChart) OR . A paper copy in the mail If you have any lab test that is abnormal or we need to change your treatment, we will call you to review the results.   Testing/Procedures: Your physician has requested that you have a stress echocardiogram. For further information please visit [email or web address] Please follow instruction sheet as given.     Follow-Up: At [facility], you and your health needs are our priority.  As part of our continuing mission to provide you with exceptional heart care, we have created designated Provider Care Teams.  These Care Teams include your primary Cardiologist (physician) and Advanced Practice Providers (APPs -  Physician Assistants and Nurse Practitioners) who all work together to provide you with the care you need, when you need it.  We recommend signing up for the patient portal called "MyChart".  Sign up information is provided on this After Visit Summary.  MyChart is used to connect with patients for Virtual Visits (Telemedicine).  Patients are able to view lab/test results, encounter notes, upcoming appointments, etc.  Non-urgent messages can be sent to your provider as well.   To learn more about what you can do with MyChart, go to [email or web address].    Your next appointment:   1 month(s)  The format for your next appointment:   In Person  Provider:   Thomasene Ripple, DO   Other Instructions

## 2020-10-03 NOTE — Addendum Note (Signed)
Addended by: Hazle Quant on: 10/03/2020 04:44 PM   Modules accepted: Orders

## 2020-10-25 ENCOUNTER — Telehealth (HOSPITAL_COMMUNITY): Payer: Self-pay

## 2020-10-25 NOTE — Telephone Encounter (Signed)
Spoke with the patient, detailed instructions given. She stated that she understood and would be here for her test. S.Addalynne Golding EMTP 

## 2020-10-26 ENCOUNTER — Other Ambulatory Visit (HOSPITAL_COMMUNITY)
Admission: RE | Admit: 2020-10-26 | Discharge: 2020-10-26 | Disposition: A | Discharge status: Home / Self Care | Payer: 59 | Source: Ambulatory Visit | Attending: Cardiology | Admitting: Cardiology

## 2020-10-26 DIAGNOSIS — Z20822 Contact with and (suspected) exposure to covid-19: Secondary | ICD-10-CM | POA: Insufficient documentation

## 2020-10-26 DIAGNOSIS — Z01812 Encounter for preprocedural laboratory examination: Secondary | ICD-10-CM | POA: Diagnosis present

## 2020-10-26 LAB — SARS CORONAVIRUS 2 (TAT 6-24 HRS): SARS Coronavirus 2: NEGATIVE

## 2020-10-29 ENCOUNTER — Ambulatory Visit (HOSPITAL_BASED_OUTPATIENT_CLINIC_OR_DEPARTMENT_OTHER): Payer: 59

## 2020-10-29 ENCOUNTER — Other Ambulatory Visit: Payer: Self-pay

## 2020-10-29 ENCOUNTER — Ambulatory Visit (HOSPITAL_COMMUNITY): Payer: 59 | Attending: Cardiovascular Disease

## 2020-10-29 DIAGNOSIS — R072 Precordial pain: Secondary | ICD-10-CM | POA: Insufficient documentation

## 2020-10-29 DIAGNOSIS — Q245 Malformation of coronary vessels: Secondary | ICD-10-CM | POA: Diagnosis not present

## 2020-10-29 MED ORDER — PERFLUTREN LIPID MICROSPHERE
1.0000 mL | INTRAVENOUS | Status: AC | PRN
  Administered 2020-10-29 (×3): 1 mL via INTRAVENOUS

## 2020-10-31 ENCOUNTER — Other Ambulatory Visit: Payer: Self-pay

## 2020-10-31 ENCOUNTER — Ambulatory Visit (INDEPENDENT_AMBULATORY_CARE_PROVIDER_SITE_OTHER): Payer: 59 | Admitting: Cardiology

## 2020-10-31 ENCOUNTER — Encounter: Payer: Self-pay | Admitting: Cardiology

## 2020-10-31 VITALS — BP 90/64 | HR 107 | Ht 63.0 in | Wt 123.0 lb

## 2020-10-31 DIAGNOSIS — Q245 Malformation of coronary vessels: Secondary | ICD-10-CM

## 2020-10-31 DIAGNOSIS — R072 Precordial pain: Secondary | ICD-10-CM | POA: Diagnosis not present

## 2020-10-31 NOTE — Progress Notes (Signed)
Cardiology Office Note:    Date:  10/31/2020   ID:  Jessica HopeBreanna Potter, DOB Jun 25, 1996, MRN [id number]  PCP:  Pearline Cablesopland, Jessica C, MD  Cardiologist:  Thomasene RippleKardie Max Romano, DO  Electrophysiologist:  None   Referring MD: Pearline Cablesopland, Jessica C, MD   Chief Complaint  Patient presents with  . Follow-up   History of Present Illness:    Jessica Potter is a 25 y.o. female with a hx of  eczema initially presented on September 03, 2020 to be evaluated for chest pain.  At that time I recommend that she get a coronary CTA due to her symptoms as well as suspicion for anomalous origin of her left main artery.  At her last visit I recommended patient undergo a stress echocardiogram she was able to get this testing done she is here today to discuss the results.  Past Medical History:  Diagnosis Date  . Eczema   . Flexural eczema 02/01/2017  . Seasonal allergic rhinitis 04/14/2016  . Vaginal Pap smear, abnormal 2020   LGSIL    Past Surgical History:  Procedure Laterality Date  . NO PAST SURGERIES      Current Medications: Current Meds  Medication Sig  . medroxyPROGESTERone (DEPO-PROVERA) 150 MG/ML injection Inject 1 mL (150 mg total) into the muscle every 3 (three) months.  . montelukast (SINGULAIR) 10 MG tablet Take 1 tablet (10 mg total) by mouth at bedtime. Use as needed for allergies   Current Facility-Administered Medications for the 10/31/20 encounter (Office Visit) with Thomasene Rippleobb, Preslea Rhodus, DO  Medication  . medroxyPROGESTERone (DEPO-PROVERA) injection 150 mg     Allergies:   Patient has no known allergies.   Social History   Socioeconomic History  . Marital status: Single    Spouse name: Not on file  . Number of children: Not on file  . Years of education: Not on file  . Highest education level: Not on file  Occupational History  . Not on file  Tobacco Use  . Smoking status: Never Smoker  . Smokeless tobacco: Never Used  Substance and Sexual Activity  . Alcohol use: No  . Drug use: No  .  Sexual activity: Yes    Birth control/protection: Injection  Other Topics Concern  . Not on file  Social History Narrative  . Not on file   Social Determinants of Health   Financial Resource Strain: Not on file  Food Insecurity: Not on file  Transportation Needs: Not on file  Physical Activity: Not on file  Stress: Not on file  Social Connections: Not on file     Family History: The patient's family history includes Diabetes in her maternal grandmother.  ROS:   Review of Systems  Constitution: Negative for decreased appetite, fever and weight gain.  HENT: Negative for congestion, ear discharge, hoarse voice and sore throat.   Eyes: Negative for discharge, redness, vision loss in right eye and visual halos.  Cardiovascular: Negative for chest pain, dyspnea on exertion, leg swelling, orthopnea and palpitations.  Respiratory: Negative for cough, hemoptysis, shortness of breath and snoring.   Endocrine: Negative for heat intolerance and polyphagia.  Hematologic/Lymphatic: Negative for bleeding problem. Does not bruise/bleed easily.  Skin: Negative for flushing, nail changes, rash and suspicious lesions.  Musculoskeletal: Negative for arthritis, joint pain, muscle cramps, myalgias, neck pain and stiffness.  Gastrointestinal: Negative for abdominal pain, bowel incontinence, diarrhea and excessive appetite.  Genitourinary: Negative for decreased libido, genital sores and incomplete emptying.  Neurological: Negative for brief paralysis, focal weakness, headaches and  loss of balance.  Psychiatric/Behavioral: Negative for altered mental status, depression and suicidal ideas.  Allergic/Immunologic: Negative for HIV exposure and persistent infections.    EKGs/Labs/Other Studies Reviewed:    The following studies were reviewed today:   EKG:  The ekg ordered today demonstrates    Stress echocardiogram IMPRESSIONS    1. This is a negative stress echocardiogram for ischemia.  2.  This is a low risk study.   FINDINGS   Exam Protocol: The patient exercised on a treadmill according to a Bruce  protocol. Definity contrast agent was given IV to delineate the left  ventricular endocardial borders.     Patient Performance: The patient exercised for 10 minutes and 0 seconds,  achieving 12.5 METS. The maximum stage achieved was IV of the Bruce  protocol. The heart rate at peak stress was 190 bpm. The target heart rate  was calculated to be 167 bpm. The  percentage of maximum predicted heart rate achieved was 96.5 %. The  baseline blood pressure was 120/82 mmHg. The blood pressure at peak stress  was 123/96 mmHg. The blood pressure response was normal. The patient  developed shortness of breath during the  stress exam.    EKG: The patient developed no abnormal EKG findings during exercise.     2D Echo Findings: Baseline regional wall motion abnormalities were not  present. There were no stress-induced wall motion abnormalities. This is a  negative stress echocardiogram for ischemia.     Zio monitor  The patient wore the monitor for 14 days starting September 03, 2020. Indication: Shortness of breath  The minimum heart rate was 59 bpm, maximum heart rate was 158 bpm, and average heart rate was 103 bpm. Predominant underlying rhythm was Sinus Rhythm.   Premature atrial complexes were rare less than 1%. Premature Ventricular complexes were rare less than 1%.  No ventricular tachycardia, no pauses, No AV block, no supraventricular tachycardia and no atrial fibrillation present.  9 patient triggered events and 7 diary events all associated with sinus tachycardia   Conclusion: Normal/unremarkable study with no significant arrhythmia.   CCTA Impression  Aorta: Normal size. No calcifications. No dissection.  Aortic Valve: Trileaflet. No calcifications.  Coronary Arteries: Normal coronary origin. Right dominance.  RCA is a large dominant  artery that gives rise to PDA and PLVB. There is no plaque.  Left main is a large artery. 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  LAD is a large vessel that has no plaque.  LCX is a non-dominant artery that gives rise to one large OM1 branch. There is no plaque.  Other findings:  Normal pulmonary vein drainage into the left atrium.  Normal let atrial appendage without a thrombus.  Normal size of the pulmonary artery.  IMPRESSION: 1. Coronary calcium score of 0. This was 0 percentile for age and sex matched control.  2. Normal coronary origin with right dominance.  3. 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  4. No evidence of CAD.  TTE Impression IMPRESSIONS  1. Left ventricular ejection fraction, by estimation, is 60 to 65%. The  left ventricle has normal function. The left ventricle has  no regional  wall motion abnormalities. Left ventricular diastolic parameters were  normal.  2. Right ventricular systolic function is normal. The right ventricular  size is normal. There is normal pulmonary artery systolic pressure. The  estimated right ventricular systolic pressure is 19.0 mmHg.  3. The mitral valve is normal in structure. No evidence of mitral valve  regurgitation. No evidence of mitral stenosis.  4. The aortic valve is normal in structure. Aortic valve regurgitation is  not visualized. No aortic stenosis is present.  5. The inferior vena cava is normal in size with greater than 50%  respiratory variability, suggesting right atrial pressure of 3 mmHg.  6. Possible anomalous Left main coronary artery off the non coronary  cusp.  Recommend coronary CTA for further assessment.   Recent Labs: 01/08/2020: ALT 13 07/18/2020: BUN 10; Creat 0.76; Hemoglobin 14.3; Platelets 306; Potassium 3.7; Sodium 139; TSH 1.58  Recent Lipid Panel    Component Value Date/Time   CHOL 166 01/08/2020 1111   TRIG 56.0 01/08/2020 1111   HDL 43.80 01/08/2020 1111   CHOLHDL 4 01/08/2020 1111   VLDL 11.2 01/08/2020 1111   LDLCALC 111 (H) 01/08/2020 1111    Physical Exam:    VS:  BP 90/64 (BP Location: Left Arm, Patient Position: Sitting, Cuff Size: Normal)   Pulse (!) 107   Ht 5\' 3"  (1.6 m)   Wt 123 lb (55.8 kg)   SpO2 98%   BMI 21.79 kg/m     Wt Readings from Last 3 Encounters:  10/31/20 123 lb (55.8 kg)  10/03/20 122 lb (55.3 kg)  09/03/20 122 lb (55.3 kg)     GEN: Well nourished, well developed in no acute distress HEENT: Normal NECK: No JVD; No carotid bruits LYMPHATICS: No lymphadenopathy CARDIAC: S1S2 noted,RRR, no murmurs, rubs, gallops RESPIRATORY:  Clear to auscultation without rales, wheezing or rhonchi  ABDOMEN: Soft, non-tender, non-distended, +bowel sounds, no guarding. EXTREMITIES: No edema, No cyanosis, no clubbing MUSCULOSKELETAL:  No deformity  SKIN: Warm and dry NEUROLOGIC:  Alert and oriented x 3, non-focal PSYCHIATRIC:  Normal affect, good insight  ASSESSMENT:    1. Anomalous coronary artery origin   2. Precordial pain    PLAN:     She is not experiencing any chest pain. Her stress echocardiogram is negative. But what I really like to do is have the patient be evaluated by our CT surgeons giving her a the slightly malignant origin of her coronary artery. She is in agreement with this. We will refer her.  The patient is in agreement with the above plan. The patient left the office in stable condition.  The patient will follow up in 1 year or sooner if needed.   Medication Adjustments/Labs and Tests Ordered: Current medicines are reviewed at length with the patient today.  Concerns regarding  medicines are outlined above.  No orders of the defined types were placed in this encounter.  No orders of the defined types were placed in this encounter.   Patient Instructions  Medication Instructions:  Your physician recommends that you continue on your current medications as directed. Please refer to the Current Medication list given to you today.  *If you need a refill on your cardiac medications before your next appointment, please call your pharmacy*   Lab Work: None If you have labs (blood work) drawn today and your tests are completely normal, you will receive your results only by: 09/05/20 MyChart Message (if you have MyChart) OR . A paper copy in  the mail If you have any lab test that is abnormal or we need to change your treatment, we will call you to review the results.   Testing/Procedures: None   Follow-Up: At [facility], you and your health needs are our priority.  As part of our continuing mission to provide you with exceptional heart care, we have created designated Provider Care Teams.  These Care Teams include your primary Cardiologist (physician) and Advanced Practice Providers (APPs -  Physician Assistants and Nurse Practitioners) who all work together to provide you with the care you need, when you need it.  We recommend signing up for the patient portal called "MyChart".  Sign up information is provided on this After Visit Summary.  MyChart is used to connect with patients for Virtual Visits (Telemedicine).  Patients are able to view lab/test results, encounter notes, upcoming appointments, etc.  Non-urgent messages can be sent to your provider as well.   To learn more about what you can do with MyChart, go to [email or web address].    Your next appointment:   1 year(s)  The format for your next appointment:   In Person  Provider:   Thomasene Ripple, DO   Other Instructions      Adopting a Healthy Lifestyle.  Know what a healthy weight is for you  (roughly BMI <25) and aim to maintain this   Aim for 7+ servings of fruits and vegetables daily   65-80+ fluid ounces of water or unsweet tea for healthy kidneys   Limit to max 1 drink of alcohol per day; avoid smoking/tobacco   Limit animal fats in diet for cholesterol and heart health - choose grass fed whenever available   Avoid highly processed foods, and foods high in saturated/trans fats   Aim for low stress - take time to unwind and care for your mental health   Aim for 150 min of moderate intensity exercise weekly for heart health, and weights twice weekly for bone health   Aim for 7-9 hours of sleep daily   When it comes to diets, agreement about the perfect plan isnt easy to find, even among the experts. Experts at the [facility] [employer] developed an idea known as the Healthy Eating Plate. Just imagine a plate divided into logical, healthy portions.   The emphasis is on diet quality:   Load up on vegetables and fruits - one-half of your plate: Aim for color and variety, and remember that potatoes dont count.   Go for whole grains - one-quarter of your plate: Whole wheat, barley, wheat berries, quinoa, oats, brown rice, and foods made with them. If you want pasta, go with whole wheat pasta.   Protein power - one-quarter of your plate: Fish, chicken, beans, and nuts are all healthy, versatile protein sources. Limit red meat.   The diet, however, does go beyond the plate, offering a few other suggestions.   Use healthy plant oils, such as olive, canola, soy, corn, sunflower and peanut. Check the labels, and avoid partially hydrogenated oil, which have unhealthy trans fats.   If youre thirsty, drink water. Coffee and tea are good in moderation, but skip sugary drinks and limit milk and dairy products to one or two daily servings.   The type of carbohydrate in the diet is more important than the amount. Some sources of carbohydrates, such as vegetables, fruits,  whole grains, and beans-are healthier than others.   Finally, stay active  Signed, Thomasene Ripple, DO  10/31/2020 1:45  PM    [facility]

## 2020-10-31 NOTE — Patient Instructions (Signed)

## 2020-11-07 ENCOUNTER — Other Ambulatory Visit (HOSPITAL_COMMUNITY)
Admission: RE | Admit: 2020-11-07 | Discharge: 2020-11-07 | Disposition: A | Discharge status: Home / Self Care | Payer: 59 | Source: Ambulatory Visit | Attending: Family Medicine | Admitting: Family Medicine

## 2020-11-07 ENCOUNTER — Ambulatory Visit (INDEPENDENT_AMBULATORY_CARE_PROVIDER_SITE_OTHER): Payer: 59

## 2020-11-07 ENCOUNTER — Other Ambulatory Visit: Payer: Self-pay

## 2020-11-07 VITALS — BP 124/86 | HR 122

## 2020-11-07 DIAGNOSIS — N898 Other specified noninflammatory disorders of vagina: Secondary | ICD-10-CM

## 2020-11-07 NOTE — Progress Notes (Signed)
Chart reviewed - agree with CMA/RN documentation.  ° °

## 2020-11-07 NOTE — Progress Notes (Signed)
Patient presents for vaginal itching for the last five days. Patient has not tried anything for this problem. Patient denies any new sexual partners.patient desires STD testing on culture as well. Patient made aware the culture will take 48 hours to process and then we will let her know the results.  Armandina Stammer RN

## 2020-11-08 ENCOUNTER — Other Ambulatory Visit: Payer: Self-pay | Admitting: Family Medicine

## 2020-11-08 LAB — CERVICOVAGINAL ANCILLARY ONLY
Bacterial Vaginitis (gardnerella): POSITIVE — AB
Candida Glabrata: NEGATIVE
Candida Vaginitis: NEGATIVE
Chlamydia: NEGATIVE
Comment: NEGATIVE
Comment: NEGATIVE
Comment: NEGATIVE
Comment: NEGATIVE
Comment: NEGATIVE
Comment: NORMAL
Neisseria Gonorrhea: NEGATIVE
Trichomonas: NEGATIVE

## 2020-11-08 MED ORDER — METRONIDAZOLE 500 MG PO TABS: 500.0000 mg | ORAL_TABLET | Freq: Two times a day (BID) | ORAL | 0 refills | Status: DC

## 2020-11-08 MED FILL — METRONIDAZOLE 500 MG TABS: 500 | 7 days supply | Qty: 14 | Fill #0

## 2020-11-08 NOTE — Addendum Note (Signed)
Addended by: Levie Heritage on: 11/08/2020 03:17 PM   Modules accepted: Orders

## 2020-11-20 ENCOUNTER — Other Ambulatory Visit: Payer: Self-pay

## 2020-11-20 ENCOUNTER — Ambulatory Visit (INDEPENDENT_AMBULATORY_CARE_PROVIDER_SITE_OTHER): Payer: 59

## 2020-11-20 ENCOUNTER — Telehealth: Payer: Self-pay

## 2020-11-20 ENCOUNTER — Other Ambulatory Visit (HOSPITAL_BASED_OUTPATIENT_CLINIC_OR_DEPARTMENT_OTHER): Payer: Self-pay | Admitting: Obstetrics & Gynecology

## 2020-11-20 VITALS — BP 113/68 | HR 101 | Wt 126.0 lb

## 2020-11-20 DIAGNOSIS — Z3042 Encounter for surveillance of injectable contraceptive: Secondary | ICD-10-CM

## 2020-11-20 MED ORDER — MEDROXYPROGESTERONE ACETATE 150 MG/ML IM SUSP: 150.0000 mg | INTRAMUSCULAR | 3 refills | Status: DC

## 2020-11-20 MED FILL — medroxyPROGESTERone ACETATE: 150 | 90 days supply | Qty: 1 | Fill #0

## 2020-11-20 NOTE — Telephone Encounter (Signed)
error 

## 2020-11-20 NOTE — Progress Notes (Addendum)
Rian Arakelian here for Depo-Provera  Injection.  Injection administered without complication. Patient will return in 3 months for next injection.  chiquita l wilson, CMA 11/20/2020  1:32 PM  Attestation of Attending Supervision of CMA/RN: Evaluation and management procedures were performed by the nurse under my supervision and collaboration.  I have reviewed the nursing note and chart, and I agree with the management and plan.  Carolyn L. Harraway-Smith, M.D., Evern Core

## 2020-11-22 ENCOUNTER — Institutional Professional Consult (permissible substitution) (INDEPENDENT_AMBULATORY_CARE_PROVIDER_SITE_OTHER): Payer: 59 | Admitting: Thoracic Surgery (Cardiothoracic Vascular Surgery)

## 2020-11-22 ENCOUNTER — Other Ambulatory Visit: Payer: Self-pay

## 2020-11-22 ENCOUNTER — Encounter: Payer: Self-pay | Admitting: Thoracic Surgery (Cardiothoracic Vascular Surgery)

## 2020-11-22 VITALS — BP 119/80 | HR 125 | Resp 20 | Ht 63.0 in | Wt 125.0 lb

## 2020-11-22 DIAGNOSIS — Q245 Malformation of coronary vessels: Secondary | ICD-10-CM | POA: Diagnosis not present

## 2020-11-22 NOTE — Progress Notes (Signed)
[address]       [place] [address]             [phone]        Jessica [facility] Medical Record #[id number] Date of Birth: [birth year]  Referring: Thomasene Ripple, DO Primary Care: Copland, Gwenlyn Found, MD Primary Cardiologist:Kardie Tobb, DO  Chief Complaint:    Chief Complaint  Patient presents with  . anomalous coronary    CT morph 1/6, ECHO 2/16  . Consult    Initial surgical consult    History of Present Illness:     [name] is a 25 year old female that is referred by Dr. 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  This initially was identified when she was being worked up for new onset chest pain.  She describes the pain is dull and associated with shortness of breath.  This originally occurred in high school when she was playing basketball and had to be removed in the game for chest pain and shortness of breath that did not improve with rest.  This occurred again recently while she was playing basketball.  And of late she has been having the symptoms at rest on a weekly basis.   Past Medical and Surgical History: Previous Chest Surgery: No Previous Chest Radiation: No Diabetes Mellitus: No.   Creatinine: 0.76  Past Medical History:  Diagnosis Date  . Eczema   . Flexural eczema 02/01/2017  . Seasonal allergic rhinitis 04/14/2016  . Vaginal Pap smear, abnormal 2020   LGSIL    Past Surgical History:  Procedure Laterality Date  . NO PAST SURGERIES      Social History: Support: Presents to appointment with her boyfriend.  Social History   Tobacco Use  Smoking Status Never Smoker  Smokeless Tobacco Never Used    Social History   Substance and Sexual Activity  Alcohol Use No     No Known Allergies  Medications: Asprin: No Statin: No Beta Blocker: No Ace Inhibitor: No Anti-Coagulation: No  Current Outpatient Medications  Medication Sig Dispense Refill   . medroxyPROGESTERone (DEPO-PROVERA) 150 MG/ML injection Inject 1 mL (150 mg total) into the muscle every 3 (three) months. 1 mL 3  . medroxyPROGESTERone (DEPO-PROVERA) 150 MG/ML injection Inject 1 mL (150 mg total) into the muscle every 3 (three) months. 1 mL 3  . metroNIDAZOLE (FLAGYL) 500 MG tablet Take 1 tablet (500 mg total) by mouth 2 (two) times daily. 14 tablet 0  . montelukast (SINGULAIR) 10 MG tablet Take 1 tablet (10 mg total) by mouth at bedtime. Use as needed for allergies 30 tablet 5   Current Facility-Administered Medications  Medication Dose Route Frequency Provider Last Rate Last Admin  . medroxyPROGESTERone (DEPO-PROVERA) injection 150 mg  150 mg Intramuscular Q90 days Conan Bowens, MD   150 mg at 11/20/20 1342    (Not in a hospital admission)   Family History  Problem Relation Age of Onset  . Diabetes Maternal Grandmother      Review of Systems:   Review of Systems  Constitutional: Negative for weight loss.  Respiratory: Positive for shortness of breath.   Cardiovascular: Positive for chest pain.  Musculoskeletal: Positive for myalgias.  Neurological: Positive for headaches.      Physical Exam: BP 119/80 (BP Location: Left Arm, Patient Position: Sitting)   Pulse (!) 125   Resp 20   Ht 5\' 3"  (1.6 m)  Wt 125 lb (56.7 kg)   SpO2 96% Comment: RA  BMI 22.14 kg/m  Physical Exam Constitutional:      General: She is not in acute distress.    Appearance: Normal appearance. She is normal weight. She is not ill-appearing.  Eyes:     Extraocular Movements: Extraocular movements intact.  Cardiovascular:     Rate and Rhythm: Tachycardia present.  Pulmonary:     Effort: Pulmonary effort is normal. No respiratory distress.  Musculoskeletal:        General: Normal range of motion.     Cervical back: Normal range of motion.  Skin:    General: Skin is warm and dry.  Neurological:     General: No focal deficit present.     Mental Status: She is alert and  oriented to person, place, and time.  Psychiatric:     Comments: Anxious       Diagnostic Studies & Laboratory data: Echo from December 2021 1. Left ventricular ejection fraction, by estimation, is 60 to 65%. The  left ventricle has normal function. The left ventricle has no regional  wall motion abnormalities. Left ventricular diastolic parameters were  normal.  2. Right ventricular systolic function is normal. The right ventricular  size is normal. There is normal pulmonary artery systolic pressure. The  estimated right ventricular systolic pressure is 19.0 mmHg.  3. The mitral valve is normal in structure. No evidence of mitral valve  regurgitation. No evidence of mitral stenosis.  4. The aortic valve is normal in structure. Aortic valve regurgitation is  not visualized. No aortic stenosis is present.  5. The inferior vena cava is normal in size with greater than 50%  respiratory variability, suggesting right atrial pressure of 3 mmHg.  6. Possible anomalous Left main coronary artery off the non coronary  cusp. Recommend coronary CTA for further assessment.    I have independently reviewed the above radiologic studies and discussed with the patient   Recent Lab Findings: Lab Results  Component Value Date   WBC 7.2 07/18/2020   HGB 14.3 07/18/2020   HCT 43.5 07/18/2020   PLT 306 07/18/2020   GLUCOSE 93 07/18/2020   CHOL 166 01/08/2020   TRIG 56.0 01/08/2020   HDL 43.80 01/08/2020   LDLCALC 111 (H) 01/08/2020   ALT 13 01/08/2020   AST 14 01/08/2020   NA 139 07/18/2020   K 3.7 07/18/2020   CL 105 07/18/2020   CREATININE 0.76 07/18/2020   BUN 10 07/18/2020   CO2 23 07/18/2020   TSH 1.58 07/18/2020   HGBA1C 5.3 01/08/2020      Assessment / Plan:   25 year old female with an 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  I personally reviewed her coronary CT.  The  ostium of the left coronary artery does appear to be arising right next to the commissure of the left and right leaflet but it is mostly on the right coronary sinus.  There does not appear to be much of an intramural course, and it does pass between the pulmonary artery and the aorta.  Based off of her symptoms I explained to her the importance of undergoing surgical correction of this anomaly.  I question whether or not this can be unroofed versus reimplanted based on its location to the right/left commissure.  If neither of these can be performed that she will need coronary artery bypass grafting to her  left coronary system.  We discussed this in detail but she was very hesitant to provide much dialogue.  She did not have any questions and did not have any comment in regards to the recommendations.  Furthermore explained to her that given the malignant course the risk of sudden death is concerning.  She will contact us to let her know her ultimate decision.     I  spent 55 minutes counseling the patient face to face.   Corliss Skains 11/22/2020 4:59 PM

## 2020-12-04 ENCOUNTER — Ambulatory Visit: Payer: 59 | Admitting: Cardiology

## 2021-02-03 ENCOUNTER — Other Ambulatory Visit (HOSPITAL_BASED_OUTPATIENT_CLINIC_OR_DEPARTMENT_OTHER): Payer: Self-pay

## 2021-02-03 ENCOUNTER — Ambulatory Visit (INDEPENDENT_AMBULATORY_CARE_PROVIDER_SITE_OTHER): Payer: 59 | Admitting: Medical

## 2021-02-03 ENCOUNTER — Telehealth: Payer: Self-pay

## 2021-02-03 ENCOUNTER — Other Ambulatory Visit: Payer: Self-pay

## 2021-02-03 VITALS — BP 132/76 | HR 100 | Resp 18 | Ht 63.0 in | Wt 125.6 lb

## 2021-02-03 DIAGNOSIS — R21 Rash and other nonspecific skin eruption: Secondary | ICD-10-CM | POA: Diagnosis not present

## 2021-02-03 MED ORDER — HYDROXYZINE HCL 10 MG PO TABS
10.0000 mg | ORAL_TABLET | Freq: Three times a day (TID) | ORAL | 0 refills | Status: DC | PRN
  Filled 2021-02-03: qty 30, 10d supply, fill #0

## 2021-02-03 MED ORDER — PREDNISONE 10 MG PO TABS
ORAL_TABLET | ORAL | 0 refills | Status: DC
  Filled 2021-02-03: qty 21, 6d supply, fill #0

## 2021-02-03 NOTE — Telephone Encounter (Signed)
Patient Name: KANDIS HENRY Gender: Female DOB: 09/14/1986 Age: 25 Y 4 M 23 D Return Phone Number: [phone] (Primary) Address: City/ State/ Zip: [place] [address] Client [facility] Night - Client Client Site [facility] - Night Physician [facility] - PHYSICIAN, UNKNOWN- MD  Relationship To Patient Self Return Phone Number [phone] (Primary) Chief Complaint Rash - Widespread Reason for Call Request to Schedule Office Appointment Initial Comment Caller would like to schedule an appointment. Caller states she also has bumps on her skin. Translation No Nurse Assessment Nurse: Allie Bossier, RN, [place] Date/Time (Eastern Time): 02/03/2021 7:29:36 AM Confirm and document reason for call. If symptomatic, describe symptoms. ---Caller states she has bumps all over that look like pimples that is on her stomach, back, and inner thighs. No fever. They are itchy. No medications.  List chronic conditions. ---eczema   Pt is scheduled with Esperanza Richters, PA today at 2:40pm

## 2021-02-03 NOTE — Progress Notes (Signed)
Subjective:    Patient ID: Jessica Potter, female    DOB: 02-Apr-1996, 25 y.o.   MRN: [id number]  HPI Pt in for for some rash/bumps on upper thighs, abdomen some on her back.   Areas started on  her abdomen. Then started to spread. These areas do itch.    Pt has been using cortisone and it does help these area.   No shortness of breath or wheezing.  Pt does have history of eczema. Gets in elbow creases and on hands.   On review no suspicious exposures to plants, animals, foods new detergents, creams or lotions.  Pt applied hydrortisone some of area did flatten out.  Review of Systems  Constitutional: Negative for chills, fatigue and fever.  Respiratory: Negative for cough, chest tightness, shortness of breath and wheezing.   Cardiovascular: Negative for chest pain and palpitations.  Gastrointestinal: Negative for abdominal distention, anal bleeding and blood in stool.  Musculoskeletal: Negative for back pain.  Skin: Positive for rash.       See hpi ad physical exam.  Neurological: Negative for dizziness, speech difficulty, weakness, numbness and headaches.  Hematological: Negative for adenopathy. Does not bruise/bleed easily.    Past Medical History:  Diagnosis Date  . Eczema   . Flexural eczema 02/01/2017  . Seasonal allergic rhinitis 04/14/2016  . Vaginal Pap smear, abnormal 2020   LGSIL     Social History   Socioeconomic History  . Marital status: Single    Spouse name: Not on file  . Number of children: Not on file  . Years of education: Not on file  . Highest education level: Not on file  Occupational History  . Not on file  Tobacco Use  . Smoking status: Never Smoker  . Smokeless tobacco: Never Used  Substance and Sexual Activity  . Alcohol use: No  . Drug use: No  . Sexual activity: Yes    Birth control/protection: Injection  Other Topics Concern  . Not on file  Social History Narrative  . Not on file   Social Determinants of Health   Financial  Resource Strain: Not on file  Food Insecurity: Not on file  Transportation Needs: Not on file  Physical Activity: Not on file  Stress: Not on file  Social Connections: Not on file  Intimate Partner Violence: Not on file    Past Surgical History:  Procedure Laterality Date  . NO PAST SURGERIES      Family History  Problem Relation Age of Onset  . Diabetes Maternal Grandmother     No Known Allergies  Current Outpatient Medications on File Prior to Visit  Medication Sig Dispense Refill  . medroxyPROGESTERone (DEPO-PROVERA) 150 MG/ML injection Inject 1 mL (150 mg total) into the muscle every 3 (three) months. 1 mL 3  . medroxyPROGESTERone (DEPO-PROVERA) 150 MG/ML injection Inject 1 mL (150 mg total) into the muscle every 3 (three) months. 1 mL 3  . medroxyPROGESTERone Acetate 150 MG/ML SUSY INJECT 1 ML (150 MG TOTAL) INTO THE MUSCLE EVERY 3 (THREE) MONTHS. 1 mL 3  . [DISCONTINUED] metoprolol tartrate (LOPRESSOR) 100 MG tablet Take 1 tablet (100 mg total) by mouth once for 1 dose. Take 2 hours prior to your CT if your heart rate is greater than 55 1 tablet 0   Current Facility-Administered Medications on File Prior to Visit  Medication Dose Route Frequency Provider Last Rate Last Admin  . medroxyPROGESTERone (DEPO-PROVERA) injection 150 mg  150 mg Intramuscular Q90 days Conan Bowens, MD  150 mg at 11/20/20 1342    BP 132/76   Pulse 100   Resp 18   Ht 5\' 3"  (1.6 m)   Wt 125 lb 9.6 oz (57 kg)   SpO2 98%   BMI 22.25 kg/m       Objective:   Physical Exam  General Mental Status- Alert. General Appearance- Not in acute distress.     Chest and Lung Exam Auscultation: Breath Sounds:-Normal.  Cardiovascular Auscultation:Rythm- Regular. Murmurs & Other Heart Sounds:Auscultation of the heart reveals- No Murmurs.  Abdomen Inspection:-Inspeection Normal. Palpation/Percussion:Note:No mass. Palpation and Percussion of the abdomen reveal- Non Tender, Non Distended + BS, no  rebound or guarding.   Neurologic Cranial Nerve exam:- CN III-XII intact(No nystagmus), symmetric smile. Strength:- 5/5 equal and symmetric strength both upper and lower extremities.  Skin- papular rash on abdomen and rt flank. Some on left abdomen ad flank as well. But more prominent rt side.  No vesicles seen. Medial thighs bilaterally  3-4 small papules present.       Assessment & Plan:  Probable allergic reaction but  exact etiology of your  allergic reaction is  unknown .  I am  prescribing oral prednisone and hydroxyzine for itching. Your rash should gradually improve. If worsening or expanding  please notify . If your rash reoccurs intermittently and no cause is identified then could consider allergist referral.  Follow up in 7 days or as needed.

## 2021-02-03 NOTE — Patient Instructions (Addendum)
Probable allergic reaction but exact etiology of your  allergic reaction is  unknown .  I am  prescribing oral prednisone and hydroxyzine for itching. Your rash should gradually improve. If worsening or expanding  please notify us. If your rash reoccurs intermittently and no cause is identified then could consider allergist referral.  Follow up in 7 days or as needed.

## 2021-02-11 ENCOUNTER — Other Ambulatory Visit: Payer: Self-pay

## 2021-02-11 ENCOUNTER — Ambulatory Visit (INDEPENDENT_AMBULATORY_CARE_PROVIDER_SITE_OTHER): Payer: 59

## 2021-02-11 ENCOUNTER — Other Ambulatory Visit (HOSPITAL_BASED_OUTPATIENT_CLINIC_OR_DEPARTMENT_OTHER): Payer: Self-pay

## 2021-02-11 VITALS — BP 109/62 | Ht 63.0 in | Wt 123.0 lb

## 2021-02-11 DIAGNOSIS — Z3042 Encounter for surveillance of injectable contraceptive: Secondary | ICD-10-CM | POA: Diagnosis not present

## 2021-02-11 MED ORDER — MEDROXYPROGESTERONE ACETATE 150 MG/ML IM SUSP
150.0000 mg | Freq: Once | INTRAMUSCULAR | Status: AC
  Administered 2021-02-11: 150 mg via INTRAMUSCULAR

## 2021-02-11 MED FILL — Medroxyprogesterone Acetate IM Susp Prefilled Syr 150 MG/ML: INTRAMUSCULAR | 90 days supply | Qty: 1 | Fill #0 | Status: AC

## 2021-02-11 NOTE — Progress Notes (Signed)
Soni Ono here for Depo-Provera  Injection.  Injection administered without complication. Patient will return in 3 months for next injection.  Nellie Chevalier l Harlei Lehrmann, CMA 02/11/2021  1:29 PM

## 2021-02-12 NOTE — Progress Notes (Deleted)
[facility] [address], [address] [place], [place] [address] [phone]  Date:  02/17/2021   Name:  Jessica Potter   DOB:  [birth year]   MRN:  [id number]  PCP:  Pearline Cables, MD    Chief Complaint: No chief complaint on file.   History of Present Illness:  Jessica Potter is a 25 y.o. very pleasant female patient who presents with the following:  Generally healthy young woman with history of allergies and eczema Here today with concern of a skin problem She was seen by my partner Ramon Dredge on May 23 She was noted to have a papular rash in her abdomen and right flank-uncertain etiology, started on prednisone and hydroxyzine  COVID-19 series Patient Active Problem List   Diagnosis Date Noted  . Precordial pain 10/03/2020  . 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 Eczema   . Vaginal Pap smear, abnormal 2020  . Flexural eczema 02/01/2017  . Seasonal allergic rhinitis 04/14/2016    Past Medical History:  Diagnosis Date  . Eczema   . Flexural eczema 02/01/2017  . Seasonal allergic rhinitis 04/14/2016  . Vaginal Pap smear, abnormal 2020   LGSIL    Past Surgical History:  Procedure Laterality Date  . NO PAST SURGERIES      Social History   Tobacco Use  . Smoking status: Never Smoker  . Smokeless tobacco: Never Used  Substance Use Topics  . Alcohol use: No  . Drug use: No    Family History  Problem Relation Age of Onset  . Diabetes Maternal Grandmother     No Known Allergies  Medication list has been reviewed and updated.  Current Outpatient Medications on File Prior to Visit  Medication Sig Dispense Refill  . hydrOXYzine (ATARAX/VISTARIL) 10 MG tablet Take 1 tablet (10 mg total) by mouth 3 (three) times daily as needed for itching. 30 tablet 0  . medroxyPROGESTERone Acetate 150 MG/ML SUSY INJECT 1 ML (150 MG TOTAL) INTO THE MUSCLE EVERY 3 (THREE) MONTHS. 1 mL 3  . predniSONE (DELTASONE) 10 MG  tablet TAKE 6 TABS BY MOUTH DAILY FOR 1 DAY, THEN 5 TABS FOR 1 DAY, THEN 4 TABS FOR 1 DAY, 3 TABS FOR 1 DAY, 2 TABS FOR 1 DAY, THEN 1 TAB FOR 1 DAY (Patient taking differently: TAKE 6 TABS BY MOUTH DAILY FOR 1 DAY, THEN 5 TABS FOR 1 DAY, THEN 4 TABS FOR 1 DAY, 3 TABS FOR 1 DAY, 2 TABS FOR 1 DAY, THEN 1 TAB FOR 1 DAY) 21 tablet 0  . [DISCONTINUED] metoprolol tartrate (LOPRESSOR) 100 MG tablet Take 1 tablet (100 mg total) by mouth once for 1 dose. Take 2 hours prior to your CT if your heart rate is greater than 55 1 tablet 0   Current Facility-Administered Medications on File Prior to Visit  Medication Dose Route Frequency Provider Last Rate Last Admin  . medroxyPROGESTERone (DEPO-PROVERA) injection 150 mg  150 mg Intramuscular Q90 days Conan Bowens, MD   150 mg at 11/20/20 1342    Review of Systems:  As per HPI- otherwise negative.   Physical Examination: There were no vitals filed for this visit. There were no vitals filed for this visit. There is no height or weight on file to calculate BMI. Ideal Body Weight:    GEN: no acute distress. HEENT: Atraumatic, Normocephalic.  Ears and Nose: No external deformity. CV: RRR, No M/G/R. No JVD. No thrill. No extra  heart sounds. PULM: CTA B, no wheezes, crackles, rhonchi. No retractions. No resp. distress. No accessory muscle use. ABD: S, NT, ND, +BS. No rebound. No HSM. EXTR: No c/c/e PSYCH: Normally interactive. Conversant.    Assessment and Plan: *** This visit occurred during the SARS-CoV-2 public health emergency.  Safety protocols were in place, including screening questions prior to the visit, additional usage of staff PPE, and extensive cleaning of exam room while observing appropriate contact time as indicated for disinfecting solutions.    Signed Abbe Amsterdam, MD

## 2021-02-17 ENCOUNTER — Ambulatory Visit: Payer: 59 | Admitting: Family Medicine

## 2021-02-18 ENCOUNTER — Ambulatory Visit: Payer: 59 | Admitting: Family

## 2021-02-24 ENCOUNTER — Other Ambulatory Visit: Payer: Self-pay

## 2021-02-24 ENCOUNTER — Encounter: Payer: Self-pay | Admitting: Emergency Medicine

## 2021-02-24 ENCOUNTER — Emergency Department (INDEPENDENT_AMBULATORY_CARE_PROVIDER_SITE_OTHER)
Admission: EM | Admit: 2021-02-24 | Discharge: 2021-02-24 | Disposition: A | Discharge status: Home / Self Care | Payer: 59 | Source: Home / Self Care

## 2021-02-24 DIAGNOSIS — J069 Acute upper respiratory infection, unspecified: Secondary | ICD-10-CM

## 2021-02-24 NOTE — Discharge Instructions (Addendum)
May take zyrtec, mucinex, flonase for symptomatic relief  Your COVID and Influenza tests are pending.  You should self quarantine until the test results are back.    Take Tylenol or ibuprofen as needed for fever or discomfort.  Rest and keep yourself hydrated.    Follow-up with your primary care provider if your symptoms are not improving.

## 2021-02-24 NOTE — ED Triage Notes (Addendum)
Patient here day 3 of having initial sore throat, now cough and pain between her scapula with some dyspnea. Has not had any covid vaccinations nor has she had covid. Patient states currently going through cardiac evaluation for chest pain and rapid pulse; has anomalous cardiac syndrome and has had echo cardiogram, stress tests, labs, etc.with another appt 7.27.22.

## 2021-02-24 NOTE — ED Provider Notes (Signed)
[facility]   [id number] 02/24/21 Arrival Time: 1304   CC: COVID symptoms  SUBJECTIVE: History from: patient.  Jessica Potter is a 25 y.o. female who presents with sore throat, cough, back pain, SOB for the last 2 days. Denies sick exposure to COVID, flu or strep. Denies recent travel. Has negative history of Covid. Has not completed Covid vaccines. Has not taken OTC medications for this. There are no aggravating or alleviating factors. Denies previous symptoms in the past. Denies fever, chills, sinus pain, rhinorrhea, wheezing, chest pain, nausea, changes in bowel or bladder habits.    ROS: As per HPI.  All other pertinent ROS negative.     Past Medical History:  Diagnosis Date   Eczema    Flexural eczema 02/01/2017   Seasonal allergic rhinitis 04/14/2016   Vaginal Pap smear, abnormal 2020   LGSIL   Past Surgical History:  Procedure Laterality Date   NO PAST SURGERIES     No Known Allergies Current Facility-Administered Medications on File Prior to Encounter  Medication Dose Route Frequency Provider Last Rate Last Admin   medroxyPROGESTERone (DEPO-PROVERA) injection 150 mg  150 mg Intramuscular Q90 days Conan Bowens, MD   150 mg at 11/20/20 1342   Current Outpatient Medications on File Prior to Encounter  Medication Sig Dispense Refill   hydrOXYzine (ATARAX/VISTARIL) 10 MG tablet Take 1 tablet (10 mg total) by mouth 3 (three) times daily as needed for itching. 30 tablet 0   medroxyPROGESTERone Acetate 150 MG/ML SUSY INJECT 1 ML (150 MG TOTAL) INTO THE MUSCLE EVERY 3 (THREE) MONTHS. 1 mL 3   predniSONE (DELTASONE) 10 MG tablet TAKE 6 TABS BY MOUTH DAILY FOR 1 DAY, THEN 5 TABS FOR 1 DAY, THEN 4 TABS FOR 1 DAY, 3 TABS FOR 1 DAY, 2 TABS FOR 1 DAY, THEN 1 TAB FOR 1 DAY (Patient taking differently: TAKE 6 TABS BY MOUTH DAILY FOR 1 DAY, THEN 5 TABS FOR 1 DAY, THEN 4 TABS FOR 1 DAY, 3 TABS FOR 1 DAY, 2 TABS FOR 1 DAY, THEN 1 TAB FOR 1 DAY) 21 tablet 0   [DISCONTINUED]  metoprolol tartrate (LOPRESSOR) 100 MG tablet Take 1 tablet (100 mg total) by mouth once for 1 dose. Take 2 hours prior to your CT if your heart rate is greater than 55 1 tablet 0   Social History   Socioeconomic History   Marital status: Single    Spouse name: Not on file   Number of children: Not on file   Years of education: Not on file   Highest education level: Not on file  Occupational History   Not on file  Tobacco Use   Smoking status: Never   Smokeless tobacco: Never  Substance and Sexual Activity   Alcohol use: No   Drug use: No   Sexual activity: Yes    Birth control/protection: Injection  Other Topics Concern   Not on file  Social History Narrative   Not on file   Social Determinants of Health   Financial Resource Strain: Not on file  Food Insecurity: Not on file  Transportation Needs: Not on file  Physical Activity: Not on file  Stress: Not on file  Social Connections: Not on file  Intimate Partner Violence: Not on file   Family History  Problem Relation Age of Onset   Diabetes Maternal Grandmother     OBJECTIVE:  Vitals:   02/24/21 1321 02/24/21 1325  BP:  113/79  Pulse:  (!) 117  Resp:  16  Temp:  99.3 F (37.4 C)  TempSrc:  Oral  Weight: 123 lb (55.8 kg)   Height: 5\' 3"  (1.6 m)      General appearance: alert; appears fatigued, but nontoxic; speaking in full sentences and tolerating own secretions HEENT: NCAT; Ears: EACs clear, TMs pearly gray; Eyes: PERRL.  EOM grossly intact. Sinuses: nontender; Nose: nares patent with clear rhinorrhea, Throat: oropharynx erythematous, cobblestoning present, tonsils non erythematous or enlarged, uvula midline  Neck: supple without LAD Lungs: unlabored respirations, symmetrical air entry; cough: mild; no respiratory distress; CTAB Heart: regular rate and rhythm.  Radial pulses 2+ symmetrical bilaterally Skin: warm and dry Psychological: alert and cooperative; normal mood and affect  LABS:  No results  found for this or any previous visit (from the past 24 hour(s)).   ASSESSMENT & PLAN:  1. Viral URI with cough    Continue supportive care at home COVID and flu testing ordered.  It will take between 2-3 days for test results. Someone will contact you regarding abnormal results.   Work note provided Patient should remain in quarantine until they have received Covid results.  If negative you may resume normal activities (go back to work/school) while practicing hand hygiene, social distance, and mask wearing.  If positive, patient should remain in quarantine for at least 5 days from symptom onset AND greater than 72 hours after symptoms resolution (absence of fever without the use of fever-reducing medication and improvement in respiratory symptoms), whichever is longer Get plenty of rest and push fluids Use OTC zyrtec for nasal congestion, runny nose, and/or sore throat Use OTC flonase for nasal congestion and runny nose Use medications daily for symptom relief Use OTC medications like ibuprofen or tylenol as needed fever or pain Call or go to the ED if you have any new or worsening symptoms such as fever, worsening cough, shortness of breath, chest tightness, chest pain, turning blue, changes in mental status.  Reviewed expectations re: course of current medical issues. Questions answered. Outlined signs and symptoms indicating need for more acute intervention. Patient verbalized understanding. After Visit Summary given.          , NP 02/24/21 1431

## 2021-02-27 LAB — COVID-19, FLU A+B NAA
Influenza A, NAA: NOT DETECTED
Influenza B, NAA: NOT DETECTED
SARS-CoV-2, NAA: DETECTED — AB

## 2021-03-01 ENCOUNTER — Encounter: Payer: Self-pay | Admitting: Family Medicine

## 2021-03-08 NOTE — Progress Notes (Addendum)
[facility] at [facility] [address], [address] [place], [place] [address] [phone]  Date:  03/10/2021   Name:  Jessica Potter   DOB:  1995/09/29   MRN:  [id number]  PCP:  Pearline Cables, MD    Chief Complaint: Rash (Hives on left arm, stomach and back, one month, itching)   History of Present Illness:  Jessica Potter is a 25 y.o. very pleasant female patient who presents with the following:  Generally healthy young woman with history of allergies and eczema.  Here today with concern of rash as well as a few other concerns She had recently contacted me about her skin and requested a dermatology recommendation-we ended up having her come in to be seen for convenience She is maintained on Depo-Provera per her GYN  COVID-19 vaccine Pap is up-to-date per GYN, though she did have LSIL-we will ask patient about follow-up plans Per [facility] recommendations, she needs a repeat Pap/no HPV testing now-she would like to go ahead and get this done  She notes a rash which started on her trunk, more on the right side of her abdomen about 1 month ago.  On discussion, she does recall there being a "herald patch" The rash on her trunk is starting to fade, she now has some similar spots in her bilateral upper arms  Her right hip will sometimes hurt intensely for just a few seconds, this occurs on rare occasion.  She notes that she did run hurdles in the past, she still plays basketball  Also, her bilateral lower legs may ache periodically, especially if she has been on her feet a lot.  She will sometimes get swelling of her ankles as well  Also, over the last year or so she has noted some itching of her mouth which may occur when she eats certain foods-mostly honeydew melon, cantaloupe's, kiwi fruit. 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   Eczema    Vaginal Pap  smear, abnormal 2020   Flexural eczema 02/01/2017   Seasonal allergic rhinitis 04/14/2016    Past Medical History:  Diagnosis Date   Eczema    Flexural eczema 02/01/2017   Seasonal allergic rhinitis 04/14/2016   Vaginal Pap smear, abnormal 2020   LGSIL    Past Surgical History:  Procedure Laterality Date   NO PAST SURGERIES      Social History   Tobacco Use   Smoking status: Never   Smokeless tobacco: Never  Substance Use Topics   Alcohol use: No   Drug use: No    Family History  Problem Relation Age of Onset   Diabetes Maternal Grandmother     No Known Allergies  Medication list has been reviewed and updated.  Current Outpatient Medications on File Prior to Visit  Medication Sig Dispense Refill   medroxyPROGESTERone Acetate 150 MG/ML SUSY INJECT 1 ML (150 MG TOTAL) INTO THE MUSCLE EVERY 3 (THREE) MONTHS. 1 mL 3   [DISCONTINUED] metoprolol tartrate (LOPRESSOR) 100 MG tablet Take 1 tablet (100 mg total) by mouth once for 1 dose. Take 2 hours prior to your CT if your heart rate is greater than 55 1 tablet 0   Current Facility-Administered Medications on File Prior to Visit  Medication Dose Route Frequency Provider Last Rate Last Admin   medroxyPROGESTERone (DEPO-PROVERA) injection 150 mg  150 mg Intramuscular Q90 days Conan Bowens, MD  150 mg at 11/20/20 1342    Review of Systems:  As per HPI- otherwise negative.   Physical Examination: Vitals:   03/10/21 1113  BP: 122/80  Pulse: (!) 112  Resp: 16  Temp: (!) 97.5 F (36.4 C)  SpO2: 98%   Vitals:   03/10/21 1113  Weight: 123 lb (55.8 kg)  Height: 5\' 3"  (1.6 m)   Body mass index is 21.79 kg/m. Ideal Body Weight: Weight in (lb) to have BMI = 25: 140.8  GEN: no acute distress.  Petite build, looks well Bilateral TM wnl, oropharynx normal.  PEERL,EOMI. no oral lesions HEENT: Atraumatic, Normocephalic.  Ears and Nose: No external deformity. CV: RRR, No M/G/R. No JVD. No thrill. No extra heart  sounds. PULM: CTA B, no wheezes, crackles, rhonchi. No retractions. No resp. distress. No accessory muscle use. ABD: S, NT, ND, +BS. No rebound. No HSM. EXTR: No c/c/e PSYCH: Normally interactive. Conversant.  Pap smear collected.  Normal pelvic exam Patient has diffuse, discrete  and mildly erythematous macules on her trunk, most prominent on the right side of her abdomen.  Appears most consistent with pityriasis rosea.  She does have a few papules on her bilateral arms, this may be keratosis pilaris Right hip shows normal range of motion with no tenderness Nontender to palpation  Pulse Readings from Last 3 Encounters:  03/10/21 (!) 112  02/24/21 (!) 117  02/03/21 100    Assessment and Plan: Pityriasis rosea  Screening for cervical cancer - Plan: Cytology - PAP  Food allergy - Plan: Ambulatory referral to Allergy, EPINEPHrine (EPIPEN 2-PAK) 0.3 mg/0.3 mL IJ SOAJ injection  Pain in both lower extremities - Plan: Comprehensive metabolic panel, Ferritin  Right hip pain  Tachycardia Pt seen today with a several concerns Rash on the trunk seems most consistent with pityriasis rosea.  I discussed this condition with patient, we went over some photographs and conservative treatment he is asked to let me know if this does not resolve within a month  Follow-up on abnormal Pap today  Discussed her food allergy.  It sounds as though she has a possible allergy to several different types of fruit.  She is not allergic to latex per her knowledge I advised her to avoid these trigger foods as future allergic reaction could be more serious If she does consume these foods, I advised her to take Benadryl as needed for itching.  I also prescribed an EpiPen and discussed how to use this in case of emergency.  We will place referral to allergy and asthma for further evaluation  She notes some pain and mild edema in her lower extremities after standing for long periods of time.  She most likely has  some mild venous insufficiency.  I recommend that she try compression, we will also check her electrolytes and ferritin  She notes occasional, severe but short-lived pain in her right hip.  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  I wonder if she may have a minor cartilage issue in her hip.  For the time being she will observe, let me know if getting worse  Patient has history of relative tachycardia, okay on recheck   This visit occurred during the SARS-CoV-2 public health emergency.  Safety protocols were in place, including screening questions prior to the visit, additional usage of staff PPE, and extensive cleaning of exam room while observing appropriate contact time as indicated for disinfecting solutions.   Signed 02/05/21, MD  Received her  labs as below, message to patient Results for orders placed or performed in visit on 03/10/21  Comprehensive metabolic panel  Result Value Ref Range   Sodium 139 135 - 145 mEq/L   Potassium 3.9 3.5 - 5.1 mEq/L   Chloride 105 96 - 112 mEq/L   CO2 25 19 - 32 mEq/L   Glucose, Bld 79 70 - 99 mg/dL   BUN 9 6 - 23 mg/dL   Creatinine, Ser 2.29 0.40 - 1.20 mg/dL   Total Bilirubin 0.7 0.2 - 1.2 mg/dL   Alkaline Phosphatase 59 39 - 117 U/L   AST 15 0 - 37 U/L   ALT 20 0 - 35 U/L   Total Protein 7.2 6.0 - 8.3 g/dL   Albumin 4.7 3.5 - 5.2 g/dL   GFR 798.92 >11.94 mL/min   Calcium 9.6 8.4 - 10.5 mg/dL  Ferritin  Result Value Ref Range   Ferritin 72.2 10.0 - 291.0 ng/mL

## 2021-03-10 ENCOUNTER — Encounter: Payer: Self-pay | Admitting: Family Medicine

## 2021-03-10 ENCOUNTER — Other Ambulatory Visit (HOSPITAL_BASED_OUTPATIENT_CLINIC_OR_DEPARTMENT_OTHER): Payer: Self-pay

## 2021-03-10 ENCOUNTER — Other Ambulatory Visit: Payer: Self-pay

## 2021-03-10 ENCOUNTER — Ambulatory Visit (INDEPENDENT_AMBULATORY_CARE_PROVIDER_SITE_OTHER): Payer: 59 | Admitting: Family Medicine

## 2021-03-10 ENCOUNTER — Other Ambulatory Visit (HOSPITAL_COMMUNITY)
Admission: RE | Admit: 2021-03-10 | Discharge: 2021-03-10 | Disposition: A | Discharge status: Home / Self Care | Payer: 59 | Source: Ambulatory Visit | Attending: Family Medicine | Admitting: Family Medicine

## 2021-03-10 VITALS — BP 122/80 | HR 96 | Temp 97.5°F | Resp 16 | Ht 63.0 in | Wt 123.0 lb

## 2021-03-10 DIAGNOSIS — L42 Pityriasis rosea: Secondary | ICD-10-CM

## 2021-03-10 DIAGNOSIS — Z124 Encounter for screening for malignant neoplasm of cervix: Secondary | ICD-10-CM | POA: Diagnosis present

## 2021-03-10 DIAGNOSIS — M79605 Pain in left leg: Secondary | ICD-10-CM | POA: Diagnosis not present

## 2021-03-10 DIAGNOSIS — M79604 Pain in right leg: Secondary | ICD-10-CM

## 2021-03-10 DIAGNOSIS — M25551 Pain in right hip: Secondary | ICD-10-CM

## 2021-03-10 DIAGNOSIS — Z91018 Allergy to other foods: Secondary | ICD-10-CM | POA: Diagnosis not present

## 2021-03-10 DIAGNOSIS — R000 Tachycardia, unspecified: Secondary | ICD-10-CM

## 2021-03-10 LAB — COMPREHENSIVE METABOLIC PANEL
ALT: 20 U/L (ref 0–35)
AST: 15 U/L (ref 0–37)
Albumin: 4.7 g/dL (ref 3.5–5.2)
Alkaline Phosphatase: 59 U/L (ref 39–117)
BUN: 9 mg/dL (ref 6–23)
CO2: 25 mEq/L (ref 19–32)
Calcium: 9.6 mg/dL (ref 8.4–10.5)
Chloride: 105 mEq/L (ref 96–112)
Creatinine, Ser: 0.81 mg/dL (ref 0.40–1.20)
GFR: 102.27 mL/min (ref >60.00)
Glucose, Bld: 79 mg/dL (ref 70–99)
Potassium: 3.9 mEq/L (ref 3.5–5.1)
Sodium: 139 mEq/L (ref 135–145)
Total Bilirubin: 0.7 mg/dL (ref 0.2–1.2)
Total Protein: 7.2 g/dL (ref 6.0–8.3)

## 2021-03-10 LAB — FERRITIN: Ferritin: 72.2 ng/mL (ref 10.0–291.0)

## 2021-03-10 MED ORDER — EPINEPHRINE 0.3 MG/0.3ML IJ SOAJ
0.3000 mg | INTRAMUSCULAR | 99 refills | Status: AC | PRN
  Filled 2021-03-10: qty 2, 2d supply, fill #0

## 2021-03-10 NOTE — Patient Instructions (Signed)
It was great to see you again today, I will be in touch with your Pap results of soon as possible  As we discussed, I think your rash is due to pityriasis rosea.  This is a harmless and fairly common rash.  Using an over-the-counter steroid cream like cortisone, moisturizers, and sun exposure may help to clear it up.  However, if its not gone in 3 to 4 weeks please alert me-sooner if getting worse  It does sound as though you have allergy to a few different types of fruit, we will get you into see an allergist for formal testing.  In the meantime, avoid eating these foods.  If you do inadvertently consume these foods, taking Benadryl may help with itching.  If you have any serious allergic reaction symptoms-such as lip or tongue swelling or difficulty breathing-you can use your EpiPen and also please call 911.  Please get familiar with how to use the EpiPen ahead of time just in case

## 2021-03-12 ENCOUNTER — Other Ambulatory Visit: Payer: Self-pay

## 2021-03-12 ENCOUNTER — Ambulatory Visit (INDEPENDENT_AMBULATORY_CARE_PROVIDER_SITE_OTHER): Payer: 59 | Admitting: Allergy

## 2021-03-12 ENCOUNTER — Encounter: Payer: Self-pay | Admitting: Allergy

## 2021-03-12 VITALS — BP 106/70 | HR 95 | Temp 98.5°F | Resp 17 | Ht 62.0 in | Wt 120.2 lb

## 2021-03-12 DIAGNOSIS — T7819XD Other adverse food reactions, not elsewhere classified, subsequent encounter: Secondary | ICD-10-CM | POA: Insufficient documentation

## 2021-03-12 DIAGNOSIS — J3089 Other allergic rhinitis: Secondary | ICD-10-CM

## 2021-03-12 DIAGNOSIS — T781XXD Other adverse food reactions, not elsewhere classified, subsequent encounter: Secondary | ICD-10-CM

## 2021-03-12 DIAGNOSIS — H1013 Acute atopic conjunctivitis, bilateral: Secondary | ICD-10-CM

## 2021-03-12 LAB — CYTOLOGY - PAP: Adequacy: ABSENT

## 2021-03-12 NOTE — Patient Instructions (Addendum)
Today's skin testing showed: Positive to grass, ragweed, weed pollen, trees, mold, dust mites, cat. Negative to select foods.  Environmental allergies Start environmental control measures as below. Use over the counter antihistamines such as Zyrtec (cetirizine), Claritin (loratadine), Allegra (fexofenadine), or Xyzal (levocetirizine) daily as needed. May take twice a day during allergy flares. May switch antihistamines every few months. Use Flonase (fluticasone) nasal spray 1 spray per nostril twice a day as needed for nasal congestion.  Nasal saline spray (i.e., Simply Saline) or nasal saline lavage (i.e., NeilMed) is recommended as needed and prior to medicated nasal sprays. May use over the counter allergy eye drops as needed.   Consider allergy injections for long term control if above medications do not help the symptoms - handout given.   Food: Continue to avoid fresh fruits/vegetables that are bothersome.  Discussed that her food triggered oral and throat symptoms are likely caused by oral food allergy syndrome (OFAS). This is caused by cross reactivity of pollen with fresh fruits and vegetables, and nuts. Symptoms are usually localized in the form of itching and burning in mouth and throat. Very rarely it can progress to more severe symptoms. Eating foods in cooked or processed forms usually minimizes symptoms. I recommended avoidance of eating the problem foods, especially during the peak season(s). Sometimes, OFAS can induce severe throat swelling or even a systemic reaction; with such instance, I advised them to report to a local ER. A list of common pollens and food cross-reactivities was provided to the patient.   Follow up in 8 months or sooner if needed.   After August 2022, I will not be in the [facility] office on a regular basis.  You can continue your care and follow up with Dr. Maurine Minister or the nurse practitioners Thurston Hole Ambs or Nehemiah Settle) in [facility]. OR you can follow up  with me in our [place] ([address]) or [employer] [phone]) location.  Sincerely,  Wyline Mood, DO  [facility] office: [phone] [facility] office: [phone] [place] office: [phone]  Reducing Pollen Exposure Pollen seasons: trees (spring), grass (summer) and ragweed/weeds (fall). Keep windows closed in your home and car to lower pollen exposure.  Install air conditioning in the bedroom and throughout the house if possible.  Avoid going out in dry windy days - especially early morning. Pollen counts are highest between 5 - 10 AM and on dry, hot and windy days.  Save outside activities for late afternoon or after a heavy rain, when pollen levels are lower.  Avoid mowing of grass if you have grass pollen allergy. Be aware that pollen can also be transported indoors on people and pets.  Dry your clothes in an automatic dryer rather than hanging them outside where they might collect pollen.  Rinse hair and eyes before bedtime. Mold Control Mold and fungi can grow on a variety of surfaces provided certain temperature and moisture conditions exist.  Outdoor molds grow on plants, decaying vegetation and soil. The major outdoor mold, Alternaria and Cladosporium, are found in very high numbers during hot and dry conditions. Generally, a late summer - fall peak is seen for common outdoor fungal spores. Rain will temporarily lower outdoor mold spore count, but counts rise rapidly when the rainy period ends. The most important indoor molds are Aspergillus and Penicillium. Dark, humid and poorly ventilated basements are ideal sites for mold growth. The next most common sites of mold growth  are the bathroom and the kitchen. Outdoor (Seasonal) Mold Control Use air conditioning and keep windows closed. Avoid exposure to decaying vegetation. Avoid leaf raking. Avoid grain handling. Consider wearing a face mask if working in  moldy areas.  Indoor (Perennial) Mold Control  Maintain humidity below 50%. Get rid of mold growth on hard surfaces with water, detergent and, if necessary, 5% bleach (do not mix with other cleaners). Then dry the area completely. If mold covers an area more than 10 square feet, consider hiring an indoor environmental professional. For clothing, washing with soap and water is best. If moldy items cannot be cleaned and dried, throw them away. Remove sources e.g. contaminated carpets. Repair and seal leaking roofs or pipes. Using dehumidifiers in damp basements may be helpful, but empty the water and clean units regularly to prevent mildew from forming. All rooms, especially basements, bathrooms and kitchens, require ventilation and cleaning to deter mold and mildew growth. Avoid carpeting on concrete or damp floors, and storing items in damp areas. Control of House Dust Mite Allergen Dust mite allergens are a common trigger of allergy and asthma symptoms. While they can be found throughout the house, these microscopic creatures thrive in warm, humid environments such as bedding, upholstered furniture and carpeting. Because so much time is spent in the bedroom, it is essential to reduce mite levels there.  Encase pillows, mattresses, and box springs in special allergen-proof fabric covers or airtight, zippered plastic covers.  Bedding should be washed weekly in hot water (130 F) and dried in a hot dryer. Allergen-proof covers are available for comforters and pillows that can't be regularly washed.  Wash the allergy-proof covers every few months. Minimize clutter in the bedroom. Keep pets out of the bedroom.  Keep humidity less than 50% by using a dehumidifier or air conditioning. You can buy a humidity measuring device called a hygrometer to monitor this.  If possible, replace carpets with hardwood, linoleum, or washable area rugs. If that's not possible, vacuum frequently with a vacuum that has a HEPA  filter. Remove all upholstered furniture and non-washable window drapes from the bedroom. Remove all non-washable stuffed toys from the bedroom.  Wash stuffed toys weekly. Pet Allergen Avoidance: Contrary to popular opinion, there are no "hypoallergenic" breeds of dogs or cats. That is because people are not allergic to an animal's hair, but to an allergen found in the animal's saliva, dander (dead skin flakes) or urine. Pet allergy symptoms typically occur within minutes. For some people, symptoms can build up and become most severe 8 to 12 hours after contact with the animal. People with severe allergies can experience reactions in public places if dander has been transported on the pet owners' clothing. Keeping an animal outdoors is only a partial solution, since homes with pets in the yard still have higher concentrations of animal allergens. Before getting a pet, ask your allergist to determine if you are allergic to animals. If your pet is already considered part of your family, try to minimize contact and keep the pet out of the bedroom and other rooms where you spend a great deal of time. As with dust mites, vacuum carpets often or replace carpet with a hardwood floor, tile or linoleum. High-efficiency particulate air (HEPA) cleaners can reduce allergen levels over time. While dander and saliva are the source of cat and dog allergens, urine is the source of allergens from rabbits, hamsters, mice and [place] pigs; so ask a non-allergic family member to clean the animal's cage.  If you have a pet allergy, talk to your allergist about the potential for allergy immunotherapy (allergy shots). This strategy can often provide long-term relief.     Skin care recommendations  Bath time: Always use lukewarm water. AVOID very hot or cold water. Keep bathing time to 5-10 minutes. Do NOT use bubble bath. Use a mild soap and use just enough to wash the dirty areas. Do NOT scrub skin vigorously.  After  bathing, pat dry your skin with a towel. Do NOT rub or scrub the skin.  Moisturizers and prescriptions:  ALWAYS apply moisturizers immediately after bathing (within 3 minutes). This helps to lock-in moisture. Use the moisturizer several times a day over the whole body. Good summer moisturizers include: Aveeno, CeraVe, Cetaphil. Good winter moisturizers include: Aquaphor, Vaseline, Cerave, Cetaphil, Eucerin, Vanicream. When using moisturizers along with medications, the moisturizer should be applied about one hour after applying the medication to prevent diluting effect of the medication or moisturize around where you applied the medications. When not using medications, the moisturizer can be continued twice daily as maintenance.  Laundry and clothing: Avoid laundry products with added color or perfumes. Use unscented hypo-allergenic laundry products such as Tide free, Cheer free & gentle, and All free and clear.  If the skin still seems dry or sensitive, you can try double-rinsing the clothes. Avoid tight or scratchy clothing such as wool. Do not use fabric softeners or dyer sheets.

## 2021-03-12 NOTE — Assessment & Plan Note (Addendum)
Rhino conjunctivitis symptoms mainly in the spring. Tried Claritin, Allegra, Flonase and visine A with some benefit. No prior allergy/ENT evaluation.   Today's skin testing showed: Positive to grass, ragweed, weed pollen, trees, mold, dust mites, cat.  Start environmental control measures as below.  Use over the counter antihistamines such as Zyrtec (cetirizine), Claritin (loratadine), Allegra (fexofenadine), or Xyzal (levocetirizine) daily as needed. May take twice a day during allergy flares. May switch antihistamines every few months.  Use Flonase (fluticasone) nasal spray 1 spray per nostril twice a day as needed for nasal congestion.   Nasal saline spray (i.e., Simply Saline) or nasal saline lavage (i.e., NeilMed) is recommended as needed and prior to medicated nasal sprays.  May use over the counter allergy eye drops as needed - declines prescription eye drops.  Consider allergy injections for long term control if above medications do not help the symptoms - handout given.

## 2021-03-12 NOTE — Assessment & Plan Note (Signed)
Perioral pruritus after eating fresh melons, cherries, bananas, mangoes, cucumbers, kiwi. No issues if it's cooked/processed.  Today's skin testing negative to select foods.  Continue to avoid fresh fruits/vegetables that are bothersome.   Discussed that her food triggered oral and throat symptoms are likely caused by oral food allergy syndrome (OFAS). This is caused by cross reactivity of pollen with fresh fruits and vegetables, and nuts. Symptoms are usually localized in the form of itching and burning in mouth and throat. Very rarely it can progress to more severe symptoms. Eating foods in cooked or processed forms usually minimizes symptoms. I recommended avoidance of eating the problem foods, especially during the peak season(s). Sometimes, OFAS can induce severe throat swelling or even a systemic reaction; with such instance, I advised them to report to a local ER. A list of common pollens and food cross-reactivities was provided to the patient.

## 2021-03-12 NOTE — Assessment & Plan Note (Signed)
.   See assessment and plan as above. 

## 2021-03-12 NOTE — Assessment & Plan Note (Signed)
>>  ASSESSMENT AND PLAN FOR OTHER ALLERGIC RHINITIS WRITTEN ON 03/12/2021  3:00 PM BY Ellamae Sia, DO  Rhino conjunctivitis symptoms mainly in the spring. Tried Claritin, Allegra, Flonase and visine A with some benefit. No prior allergy/ENT evaluation.  Today's skin testing showed: Positive to grass, ragweed, weed pollen, trees, mold, dust mites, cat. Start environmental control measures as below. Use over the counter antihistamines such as Zyrtec (cetirizine), Claritin (loratadine), Allegra (fexofenadine), or Xyzal (levocetirizine) daily as needed. May take twice a day during allergy flares. May switch antihistamines every few months. Use Flonase (fluticasone) nasal spray 1 spray per nostril twice a day as needed for nasal congestion.  Nasal saline spray (i.e., Simply Saline) or nasal saline lavage (i.e., NeilMed) is recommended as needed and prior to medicated nasal sprays. May use over the counter allergy eye drops as needed - declines prescription eye drops. Consider allergy injections for long term control if above medications do not help the symptoms - handout given.

## 2021-03-12 NOTE — Progress Notes (Signed)
New Patient Note  RE: Jessica Potter MRN: [id number] DOB: 11-Oct-1995 Date of Office Visit: 03/12/2021  Consult requested by: Pearline Cablesopland, Jessica C, MD Primary care provider: Pearline Cablesopland, Jessica C, MD  Chief Complaint: Allergic Reaction (To certain fruits: watermelon, cherry, banana and cucumbers, tongue gets itchy. Seasonal allergies- itchy water eyes.) and Allergy Testing  History of Present Illness: I had the pleasure of seeing Jessica Potter for initial evaluation at the [facility] on 03/12/2021. She is a 25 y.o. female, who is referred here by Copland, Jessica FoundJessica C, MD for the evaluation of food allergies.  Food: Patient noted perioral pruritus after eating fresh watermelon, melons, cherry, bananas, kiwi, mangoes, cucumbers. Tolerates cooked/processed forms with no issues. Denies any other associated symptoms.  Past work up includes: none. Dietary History: patient has been eating other foods including milk, eggs, peanut, treenuts, sesame, shellfish, fish, soy, wheat, meats, some fruits and vegetables.  She reports reading labels and avoiding certain fresh fruits and vegetables in diet completely.   Rhinitis: She reports symptoms of sneezing, itchy eyes, nasal congestion, rhinorrhea. Symptoms have been going on for 2 years. The symptoms are present mainly in the spring. Anosmia: no. Headache: no. She has used Claritin, allegra, visine A, Flonase with some improvement in symptoms. Sinus infections: no. Previous work up includes: none. Previous ENT evaluation: no. Previous sinus imaging: no. History of nasal polyps: no. Last eye exam: not recently. History of reflux: no.  03/10/2021 PCP visit: "Also, over the last year or so she has noted some itching of her mouth which may occur when she eats certain foods-mostly honeydew melon, cantaloupe's, kiwi fruit."  Assessment and Plan: [name] is a 25 y.o. female with: Other allergic rhinitis Rhino conjunctivitis symptoms mainly  in the spring. Tried Claritin, Allegra, Flonase and visine A with some benefit. No prior allergy/ENT evaluation.  Today's skin testing showed: Positive to grass, ragweed, weed pollen, trees, mold, dust mites, cat. Start environmental control measures as below. Use over the counter antihistamines such as Zyrtec (cetirizine), Claritin (loratadine), Allegra (fexofenadine), or Xyzal (levocetirizine) daily as needed. May take twice a day during allergy flares. May switch antihistamines every few months. Use Flonase (fluticasone) nasal spray 1 spray per nostril twice a day as needed for nasal congestion.  Nasal saline spray (i.e., Simply Saline) or nasal saline lavage (i.e., NeilMed) is recommended as needed and prior to medicated nasal sprays. May use over the counter allergy eye drops as needed - declines prescription eye drops. Consider allergy injections for long term control if above medications do not help the symptoms - handout given.   Allergic conjunctivitis of both eyes See assessment and plan as above.  Oral allergy syndrome, subsequent encounter Perioral pruritus after eating fresh melons, cherries, bananas, mangoes, cucumbers, kiwi. No issues if it's cooked/processed. Today's skin testing negative to select foods. Continue to avoid fresh fruits/vegetables that are bothersome.  Discussed that her food triggered oral and throat symptoms are likely caused by oral food allergy syndrome (OFAS). This is caused by cross reactivity of pollen with fresh fruits and vegetables, and nuts. Symptoms are usually localized in the form of itching and burning in mouth and throat. Very rarely it can progress to more severe symptoms. Eating foods in cooked or processed forms usually minimizes symptoms. I recommended avoidance of eating the problem foods, especially during the peak season(s). Sometimes, OFAS can induce severe throat swelling or even a systemic reaction; with such instance, I advised them to report  to a  local ER. A list of common pollens and food cross-reactivities was provided to the patient.   Return in about 8 months (around 11/11/2021).  No orders of the defined types were placed in this encounter.  Lab Orders  No laboratory test(s) ordered today    Other allergy screening: Asthma: no Medication allergy: no Hymenoptera allergy: no Urticaria: no Eczema:yes Uses cortisone cram prn.  History of recurrent infections suggestive of immunodeficency: no  Diagnostics: Skin Testing: Environmental allergy panel and select foods. Positive test to: grass, ragweed, weed pollen, trees, mold, dust mites, cat. Negative test to: select foods.  Results discussed with patient/family.  Airborne Adult Perc - 03/12/21 1359     Time Antigen Placed 0205    Allergen Manufacturer Waynette Buttery    Location Back    Number of Test 59    1. Control-Buffer 50% Glycerol Negative    2. Control-Histamine 1 mg/ml 2+    3. Albumin saline Negative    4. Bahia 3+    5. French Southern Territories Negative    6. Johnson Negative    7. Kentucky Blue Negative    8. Meadow Fescue Negative    9. Perennial Rye Negative    10. Sweet Vernal 2+    11. Timothy 4+    12. Cocklebur Negative    13. Burweed Marshelder Negative    14. Ragweed, short Negative    15. Ragweed, Giant Negative    16. Plantain,  English Negative    17. Lamb's Quarters 2+    18. Sheep Sorrell 2+    19. Rough Pigweed Negative    20. Marsh Elder, Rough Negative    21. Mugwort, Common Negative    23. Birch mix Negative    24. Beech American 2+    25. Box, Elder 3+    26. Cedar, red Negative    27. Cottonwood, Guinea-Bissau Negative    28. Elm mix Negative    29. Hickory Negative    30. Maple mix Negative    31. Oak, Guinea-[place]issau mix Negative    32. Pecan Pollen Negative    33. Pine mix Negative    34. Sycamore Eastern Negative    35. Walnut, Black Pollen 4+    36. Alternaria alternata 4+    37. Cladosporium Herbarum Negative    38. Aspergillus mix Negative     39. Penicillium mix Negative    40. Bipolaris sorokiniana (Helminthosporium) Negative    41. Drechslera spicifera (Curvularia) Negative    42. Mucor plumbeus Negative    43. Fusarium moniliforme --   +/-   44. Aureobasidium pullulans (pullulara) Negative    45. Rhizopus oryzae Negative    46. Botrytis cinera Negative    47. Epicoccum nigrum 2+    48. Phoma betae Negative    49. Candida Albicans Negative    50. Trichophyton mentagrophytes Negative    51. Mite, D Farinae  5,000 AU/ml Negative    52. Mite, D Pteronyssinus  5,000 AU/ml Negative    53. Cat Hair 10,000 BAU/ml 2+    54.  Dog Epithelia Negative    55. Mixed Feathers Negative    56. Horse Epithelia Negative    57. Cockroach, German Negative    58. Mouse Negative    59. Tobacco Leaf Negative             Intradermal - 03/12/21 1443     Time Antigen Placed 1443    Allergen Manufacturer Waynette Buttery    Location Arm  Number of Test 8    Control Negative    French Southern Territ[place] 2+    Johnson 2+    Ragweed mix 3+    Mold 2 Negative    Mold 3 3+    Dog Negative    Cockroach Negative             Food Adult Perc - 03/12/21 1400     Time Antigen Placed 0205    Allergen Manufacturer Waynette Buttery    Location Back    Number of allergen test 4    54. Cucumber Negative    57. Banana Negative    61. Cantaloupe Negative    62. 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 2 each PRN   fexofenadine (ALLEGRA) 180 MG tablet Take 1 tablet by mouth daily.     medroxyPROGESTERone Acetate 150 MG/ML SUSY INJECT 1 ML (150 MG TOTAL) INTO THE MUSCLE EVERY 3 (THREE) MONTHS. 1 mL 3   triamcinolone cream (KENALOG) 0.1 % Apply topically 2 (two) times daily.     Current Facility-Administered Medications  Medication Dose Route Frequency Provider Last Rate Last Admin   medroxyPROGESTERone (DEPO-PROVERA) injection 150 mg  150 mg Intramuscular Q90 days Conan Bowens, MD   150 mg at 11/20/20 1342   Allergies: No Known Allergies Social History: Social History   Socioeconomic History   Marital status: Single    Spouse name: Not on file   Number of children: Not on file   Years of education: Not on file   Highest education level: Not on file  Occupational History   Not on file  Tobacco Use   Smoking status: Never   Smokeless tobacco: Never  Substance and Sexual Activity   Alcohol use: No   Drug use: No   Sexual activity: Yes    Birth control/protection: Injection  Other Topics Concern   Not on file  Social History Narrative   Not on file   Social Determinants of Health   Financial Resource Strain: Not on file  Food Insecurity: Not on file  Transportation Needs: Not on file  Physical Activity: Not on file  Stress: Not on file  Social Connections: Not on file   Lives in an apartment. Smoking: denies Occupation: Veterinary surgeon HistorySurveyor, minerals in the house: no Engineer, civil (consulting) in the family room: no Carpet in the bedroom: no Heating: electric Cooling: central Pet: no  Family History: Family History  Problem Relation Age of Onset   Diabetes Maternal Grandmother    Problem  Relation Asthma                                   No  Eczema                                No  Food allergy                          No  Allergic rhino conjunctivitis     No  Review of Systems  Constitutional:  Negative for appetite change, chills, fever and unexpected weight change.  HENT:  Negative for congestion and rhinorrhea.   Eyes:  Negative for itching.  Respiratory:  Negative for cough, chest tightness, shortness of breath and wheezing.   Cardiovascular:  Negative for chest pain.  Gastrointestinal:  Negative for abdominal pain.  Genitourinary:  Negative for difficulty urinating.  Skin:  Negative for rash.  Allergic/Immunologic: Positive for environmental allergies.  Neurological:  Negative for headaches.   Objective: BP 106/70 (BP Location: Left Arm, Patient Position: Sitting, Cuff Size: Normal)   Pulse 95   Temp 98.5 F (36.9 Potter) (Temporal)   Resp 17   Ht 5\' 2"  (1.575 m)   Wt 120 lb 3.2 oz (54.5 kg)   SpO2 97%   BMI 21.98 kg/m  Body mass index is 21.98 kg/m. Physical Exam Vitals and nursing note reviewed.  Constitutional:      Appearance: Normal appearance. She is well-developed.  HENT:     Head: Normocephalic and atraumatic.     Right Ear: Tympanic membrane and external ear normal.     Left Ear: Tympanic membrane and external ear normal.     Nose: Nose normal.     Comments: Transverse nasal crease    Mouth/Throat:     Mouth: Mucous membranes are moist.     Pharynx: Oropharynx is clear.  Eyes:     Conjunctiva/sclera: Conjunctivae normal.  Cardiovascular:     Rate and Rhythm: Normal rate and regular rhythm.     Heart sounds: Normal heart sounds. No murmur heard.   No friction rub. No gallop.  Pulmonary:     Effort: Pulmonary effort is normal.     Breath sounds: Normal breath sounds. No wheezing, rhonchi or rales.  Musculoskeletal:     Cervical back: Neck supple.  Skin:    General: Skin is warm.     Findings: No rash.   Neurological:     Mental Status: She is alert and oriented to person, place, and time.  Psychiatric:        Behavior: Behavior normal.   The plan was reviewed with the patient/family, and all questions/concerned were addressed.  It was my pleasure to see Liley today and participate in her care. Please feel free to contact me with any questions or concerns.  Sincerely,  Kaleen Odea, DO Allergy & Immunology  [facility] office: [phone] [facility] office: [phone]

## 2021-03-20 ENCOUNTER — Encounter: Payer: Self-pay | Admitting: Family Medicine

## 2021-03-20 DIAGNOSIS — R87612 Low grade squamous intraepithelial lesion on cytologic smear of cervix (LGSIL): Secondary | ICD-10-CM

## 2021-03-24 ENCOUNTER — Telehealth: Payer: Self-pay | Admitting: General Practice

## 2021-03-24 NOTE — Telephone Encounter (Signed)
Called patient to schedule for COLPO.  Left message on VM for patient to give our office a call to schedule.

## 2021-04-21 ENCOUNTER — Ambulatory Visit: Payer: 59 | Admitting: Obstetrics & Gynecology

## 2021-04-21 ENCOUNTER — Encounter: Payer: Self-pay | Admitting: General Practice

## 2021-04-21 ENCOUNTER — Other Ambulatory Visit (HOSPITAL_COMMUNITY)
Admission: RE | Admit: 2021-04-21 | Discharge: 2021-04-21 | Disposition: A | Discharge status: Home / Self Care | Payer: 59 | Source: Ambulatory Visit | Attending: Obstetrics & Gynecology | Admitting: Obstetrics & Gynecology

## 2021-04-21 ENCOUNTER — Encounter: Payer: Self-pay | Admitting: Obstetrics & Gynecology

## 2021-04-21 ENCOUNTER — Other Ambulatory Visit: Payer: Self-pay

## 2021-04-21 VITALS — BP 121/75 | HR 99 | Ht 63.0 in | Wt 123.0 lb

## 2021-04-21 DIAGNOSIS — Z01812 Encounter for preprocedural laboratory examination: Secondary | ICD-10-CM

## 2021-04-21 DIAGNOSIS — R87612 Low grade squamous intraepithelial lesion on cytologic smear of cervix (LGSIL): Secondary | ICD-10-CM | POA: Insufficient documentation

## 2021-04-21 DIAGNOSIS — Z3202 Encounter for pregnancy test, result negative: Secondary | ICD-10-CM | POA: Diagnosis not present

## 2021-04-21 LAB — POCT URINE PREGNANCY: Preg Test, Ur: NEGATIVE

## 2021-04-21 NOTE — Progress Notes (Signed)
Patient given informed consent, signed copy in the chart, time out was performed.  Placed in lithotomy position. Cervix viewed with speculum and colposcope after application of acetic acid.  03/10/2021 Satisfactory for evaluation; transformation zone component ABSENT.    Diagnosis - Low grade squamous intraepithelial lesion (LSIL) Abnormal     Colposcopy adequate?  yes Acetowhite lesions?yes Punctation? no Mosaicism? No Abnormal vasculature?  no Biopsies? yes ECC? yes  Patient was given post procedure instructions.  She will return in 2 weeks for results or via MyChart.   Dwan Hemmelgarn L. Harraway-Smith, M.D., Evern Core

## 2021-04-23 LAB — SURGICAL PATHOLOGY

## 2021-04-24 ENCOUNTER — Telehealth: Payer: Self-pay

## 2021-04-24 NOTE — Telephone Encounter (Signed)
-----   Message from Willodean Rosenthal, MD sent at 04/23/2021  4:49 PM EDT ----- Please call pt. Her cervical bx shows low grade changes. We will repeat the PAP in 1 year.   Thx,  Clh-S

## 2021-04-24 NOTE — Telephone Encounter (Signed)
Patient called and made aware that her colposcopy biopsies came back low grade. So no changes and she needs to be sure to repeat her pap smear in one year. Patient states understanding. Armandina Stammer RN

## 2021-04-29 ENCOUNTER — Other Ambulatory Visit: Payer: Self-pay

## 2021-04-29 ENCOUNTER — Other Ambulatory Visit (HOSPITAL_BASED_OUTPATIENT_CLINIC_OR_DEPARTMENT_OTHER): Payer: Self-pay

## 2021-04-29 ENCOUNTER — Ambulatory Visit (INDEPENDENT_AMBULATORY_CARE_PROVIDER_SITE_OTHER): Payer: 59

## 2021-04-29 DIAGNOSIS — Z3042 Encounter for surveillance of injectable contraceptive: Secondary | ICD-10-CM | POA: Diagnosis not present

## 2021-04-29 MED FILL — Medroxyprogesterone Acetate IM Susp Prefilled Syr 150 MG/ML: INTRAMUSCULAR | 84 days supply | Qty: 1 | Fill #1 | Status: AC

## 2021-04-29 NOTE — Progress Notes (Signed)
Patient tolerated injection well. Patient scheduled next injection in three months. Armandina Stammer RN

## 2021-07-15 ENCOUNTER — Other Ambulatory Visit: Payer: Self-pay

## 2021-07-15 ENCOUNTER — Other Ambulatory Visit (HOSPITAL_BASED_OUTPATIENT_CLINIC_OR_DEPARTMENT_OTHER): Payer: Self-pay

## 2021-07-15 ENCOUNTER — Ambulatory Visit (INDEPENDENT_AMBULATORY_CARE_PROVIDER_SITE_OTHER): Payer: 59

## 2021-07-15 DIAGNOSIS — Z3042 Encounter for surveillance of injectable contraceptive: Secondary | ICD-10-CM | POA: Diagnosis not present

## 2021-07-15 MED ORDER — MEDROXYPROGESTERONE ACETATE 150 MG/ML IM SUSP: 150.0000 mg | Freq: Once | INTRAMUSCULAR | Status: DC

## 2021-07-15 MED FILL — Medroxyprogesterone Acetate IM Susp Prefilled Syr 150 MG/ML: INTRAMUSCULAR | 84 days supply | Qty: 1 | Fill #2 | Status: AC

## 2021-07-15 MED FILL — Medroxyprogesterone Acetate IM Susp Prefilled Syr 150 MG/ML: INTRAMUSCULAR | 90 days supply | Qty: 1 | Fill #2 | Status: CN

## 2021-07-15 NOTE — Progress Notes (Signed)
Patient presents for Depo Provera injection. Patient mentioned that she is having painful intercourse - patient made aware that she should follow up with provider for this and appointment scheduled. Armandina Stammer RN

## 2021-07-16 MED ORDER — MEDROXYPROGESTERONE ACETATE 150 MG/ML IM SUSP
150.0000 mg | Freq: Once | INTRAMUSCULAR | Status: AC
Start: 2021-07-16 — End: 2021-07-15
  Administered 2021-07-15: 150 mg via INTRAMUSCULAR

## 2021-07-16 NOTE — Addendum Note (Signed)
Addended by: Anell Barr on: 07/16/2021 04:25 PM   Modules accepted: Orders

## 2021-08-06 ENCOUNTER — Other Ambulatory Visit: Payer: Self-pay

## 2021-08-06 ENCOUNTER — Ambulatory Visit (INDEPENDENT_AMBULATORY_CARE_PROVIDER_SITE_OTHER): Payer: 59 | Admitting: Obstetrics & Gynecology

## 2021-08-06 ENCOUNTER — Other Ambulatory Visit (HOSPITAL_BASED_OUTPATIENT_CLINIC_OR_DEPARTMENT_OTHER): Payer: Self-pay

## 2021-08-06 ENCOUNTER — Encounter: Payer: Self-pay | Admitting: Obstetrics & Gynecology

## 2021-08-06 VITALS — BP 116/74 | HR 106 | Ht 63.0 in | Wt 132.0 lb

## 2021-08-06 DIAGNOSIS — N941 Unspecified dyspareunia: Secondary | ICD-10-CM

## 2021-08-06 DIAGNOSIS — N760 Acute vaginitis: Secondary | ICD-10-CM

## 2021-08-06 MED ORDER — LIDOCAINE HCL URETHRAL/MUCOSAL 2 % EX GEL
1.0000 "application " | CUTANEOUS | 2 refills | Status: DC | PRN
  Filled 2021-08-06: qty 30, 30d supply, fill #0

## 2021-08-06 MED ORDER — TRIAMCINOLONE ACETONIDE 0.1 % EX CREA
TOPICAL_CREAM | Freq: Two times a day (BID) | CUTANEOUS | 3 refills | Status: DC
  Filled 2021-08-06: qty 30, 30d supply, fill #0

## 2021-08-06 NOTE — Progress Notes (Signed)
History:  25 y.o. G0P0000 here today for eval of insertion dyspareunia.  Pt reports that this has been occurring for about 4 weeks. It is not at any other time. No new partner of other sx. Pt denies trauma.     The following portions of the patient's history were reviewed and updated as appropriate: allergies, current medications, past family history, past medical history, past social history, past surgical history and problem list.  Review of Systems:  Pertinent items are noted in HPI.    Objective:  Physical Exam Blood pressure 116/74, pulse (!) 106, height 5\' 3"  (1.6 m), weight 132 lb (59.9 kg).  CONSTITUTIONAL: Well-developed, well-nourished female in no acute distress.  HENT:  Normocephalic, atraumatic EYES: Conjunctivae and EOM are normal. No scleral icterus.  NECK: Normal range of motion SKIN: Skin is warm and dry. No rash noted. Not diaphoretic.No pallor. NEUROLGIC: Alert and oriented to person, place, and time. Normal coordination.  Pelvic: Normal appearing external genitalia; just adjacent to the labia minora there are bilateral erythematous areas. These areas are tender to palpation with a q-tip. Normal discharge.   Assessment & Plan:   Jessica Potter was seen today for dyspareunia.  Diagnoses and all orders for this visit:  Dyspareunia in female -     lidocaine (XYLOCAINE) 2 % jelly; Apply 1 application topically as needed.  Acute vulvovaginitis -     triamcinolone cream (KENALOG) 0.1 %; Apply topically 2 (two) times daily.   F/u 2-4 weeks or sooner prn   Elchonon Maxson L. Harraway-Smith, M.D., Kaleen Odea

## 2021-08-06 NOTE — Progress Notes (Signed)
Painful intercourse. No bleeding after intercourse. Armandina Stammer RN

## 2021-08-11 ENCOUNTER — Encounter: Payer: Self-pay | Admitting: Obstetrics & Gynecology

## 2021-09-17 ENCOUNTER — Other Ambulatory Visit: Payer: Self-pay

## 2021-09-17 ENCOUNTER — Encounter: Payer: Self-pay | Admitting: Family Medicine

## 2021-09-17 ENCOUNTER — Ambulatory Visit (INDEPENDENT_AMBULATORY_CARE_PROVIDER_SITE_OTHER): Payer: 59 | Admitting: Family Medicine

## 2021-09-17 VITALS — BP 112/73 | HR 109 | Wt 132.0 lb

## 2021-09-17 DIAGNOSIS — N942 Vaginismus: Secondary | ICD-10-CM | POA: Diagnosis not present

## 2021-09-17 DIAGNOSIS — N941 Unspecified dyspareunia: Secondary | ICD-10-CM

## 2021-09-17 NOTE — Progress Notes (Signed)
° °  Subjective:    Patient ID: Jessica Potter, female    DOB: [birth year], 26 y.o.   MRN: [id number]  HPI  Patient seen for follow-up of insertional dyspareunia.  She has been using the steroid cream twice a day for the past 3 weeks.  She has not tried having sex.  She reports pain starting 3 months ago and occurred every time she had intercourse.  She has not tried to insert anything in the vagina by herself.  She does find that using lubrication does minimize the amount of pain that she has.  Her partner is a circumcised female.  She is on Depo-Provera and does feel that she has vaginal dryness.  Review of Systems     Objective:   Physical Exam Vitals reviewed. Exam conducted with a chaperone present.  Constitutional:      Appearance: Normal appearance.  HENT:     Head: Normocephalic and atraumatic.  Abdominal:     Hernia: There is no hernia in the left inguinal area or right inguinal area.  Genitourinary:    Labia:        Right: No rash, tenderness or lesion.        Left: No rash, tenderness or lesion.      Comments: Tenderness to the bulbospongiosus muscle bilaterally, particularly in the lower part of the introitus Lymphadenopathy:     Lower Body: No right inguinal adenopathy. No left inguinal adenopathy.  Neurological:     Mental Status: She is alert.      Assessment & Plan:  1. Dyspareunia in female Discussed techniques to improve vaginal dryness, including using either water-based or silicone based lubrication with every sexual encounter and reapplying as needed depending on how long the sexual encounter last.  With the progesterone, it is very common to have vaginal dryness, which means that she will need a longer amount of foreplay prior to penetration.  Also recommended being in a position where she can control depth, speed, angle, etc. she does have lidocaine jelly.  She can use this approximately 30 minutes prior to intercourse. - Ambulatory referral to Physical  Therapy  2. Vaginismus Will refer to physical therapy for evaluation - Ambulatory referral to Physical Therapy

## 2021-09-30 ENCOUNTER — Ambulatory Visit (INDEPENDENT_AMBULATORY_CARE_PROVIDER_SITE_OTHER): Payer: 59

## 2021-09-30 ENCOUNTER — Other Ambulatory Visit: Payer: Self-pay

## 2021-09-30 ENCOUNTER — Other Ambulatory Visit (HOSPITAL_BASED_OUTPATIENT_CLINIC_OR_DEPARTMENT_OTHER): Payer: Self-pay

## 2021-09-30 ENCOUNTER — Other Ambulatory Visit: Payer: Self-pay | Admitting: Obstetrics & Gynecology

## 2021-09-30 DIAGNOSIS — Z3042 Encounter for surveillance of injectable contraceptive: Secondary | ICD-10-CM | POA: Diagnosis not present

## 2021-09-30 MED ORDER — MEDROXYPROGESTERONE ACETATE 150 MG/ML IM SUSP
150.0000 mg | Freq: Once | INTRAMUSCULAR | Status: AC
Start: 2021-09-30 — End: 2021-09-30
  Administered 2021-09-30: 150 mg via INTRAMUSCULAR

## 2021-09-30 MED ORDER — MEDROXYPROGESTERONE ACETATE 150 MG/ML IM SUSY
PREFILLED_SYRINGE | INTRAMUSCULAR | 3 refills | Status: DC
  Filled 2021-09-30: qty 1, 84d supply, fill #0
  Filled 2021-12-16: qty 1, 84d supply, fill #1
  Filled 2022-03-11: qty 1, 84d supply, fill #2
  Filled 2022-06-03: qty 1, 84d supply, fill #3

## 2021-09-30 NOTE — Progress Notes (Signed)
Patient presented for Depo Provera injection. See MAR for details. Armandina Stammer RN

## 2021-09-30 NOTE — Progress Notes (Signed)
Chart reviewed - agree with CMA/RN documentation.  ° °

## 2021-10-08 ENCOUNTER — Ambulatory Visit: Payer: 59 | Attending: Family Medicine

## 2021-10-08 ENCOUNTER — Other Ambulatory Visit: Payer: Self-pay

## 2021-10-08 DIAGNOSIS — N942 Vaginismus: Secondary | ICD-10-CM | POA: Insufficient documentation

## 2021-10-08 DIAGNOSIS — N941 Unspecified dyspareunia: Secondary | ICD-10-CM | POA: Diagnosis present

## 2021-10-08 DIAGNOSIS — M62838 Other muscle spasm: Secondary | ICD-10-CM | POA: Insufficient documentation

## 2021-10-08 DIAGNOSIS — R279 Unspecified lack of coordination: Secondary | ICD-10-CM | POA: Diagnosis not present

## 2021-10-08 DIAGNOSIS — K5900 Constipation, unspecified: Secondary | ICD-10-CM | POA: Insufficient documentation

## 2021-10-08 NOTE — Patient Instructions (Addendum)
Squatty potty: When your knees are level or below the level of your hips, pelvic floor muscles are pressed against rectum, preventing ease of bowel movement. By getting knees above the level of the hips, these pelvic floor muscles relax, allowing easier passage of bowel movement. ? Ways to get knees above hips: o Squatty Potty (7inch and 9inch versions) o Small stool o Roll of toilet paper under each foot o Hardback book or stack of magazines under each foot  Relaxed Toileting mechanics: Once in this position, make sure to lean forward with forearms on thighs, wide knees, relaxed stomach, and breathe.  Vulvar/vaginal Massage: This is a technique to help decrease painful sensitivity in the vaginal area. It can also help to restore normal moisture levels in the vaginal tissues. With coconut oil, aloe, jojoba oil, or a specific vaginal moisturizer, gently massage into vaginal tissues. Think of this as part of your post-shower routine and moisturizing just like you would the rest of the body with lotion. This helps to increase good blood flow to the vaginal tissues. In addition, it also teaches the body that touch to the vagina does not have to be painful or threatening, but moisturizing and gentle.   Vaginal trainers  Prior to Use:   Wash the vaginal trainer with soap and water before and after each use.   Use a water-soluble lubricant like Slippery Stuff or Surgulibe.   Avoid using Vaseline, coconut oil, or other oil-based lubricants. They are not water-soluble and can be irritating to the tissues in the vagina.   Do not use silicon-based lubricants with a silicon vaginal trainer. Using a siliconbased lubricant with a silicon device can contribute to break down of the material.  Setting Up Your Space   Work in a comfortable room lying on your back on a bed or couch with your knees bent and knees relaxed open. Use pillows underneath your knees as they are relaxed open and to support  your upper back and head. Place a towel underneath your bottom to collect any lubricant.   Place your vaginal trainers and lubricant on a towel next to you within arm's reach for easy access.   Starting to use your trainer:  o Take 10-20 deep breaths to quiet your nervous system  o Perform stretches to help relax your hips and pelvic floor such as child's pose, cat/cow, or happy baby pose  Using Your Trainers   Coat the smallest vaginal trainer, or the size you are most comfortable using, with lubricant   Place the tip of the trainer at the opening to the vagina.   Take a few deep breaths to adjust to the sensation of the lubricant and trainer.   Slowly insert the rounded end of the trainer into your vaginal opening as far as you are comfortable. Pause and breathe if you experience pain, tension, or muscle guarding at any time. Once you feel comfortable gently slide trainer deeper into the vaginal canal as far as it will go without causing pain or discomfort.  Progressing with your Trainers   Gently press the trainer toward the bottom and sides of the vaginal opening to give it a gentle stretch. Pause and breathe at each spot and tension melts away.   Once fully inserted, turn the trainer clockwise and counterclockwise to produce different sensations   Slowly move the trainer in and out as you breathe and focus on staying as relaxed as possible  To progress to the next size gradually, once one  trainer is completely pain-free and comfortable to use, insert that smaller trainer first for 5-10 minutes and then follow with the next largest size trainer for 5-10 minutes. Gradually decrease the length of time using the smaller trainer as you increase the length of time using the larger trainer.   Move at your pace ad what's comfortable for you.  Wrapping up your session   Use trainers for 5-10 minutes every other day or 3 times a week   Wash and dry your vaginal trainers after use  Other  considerations: Try to approach using vaginal trainers from a place of curiosity instead of judgment - what can my body do today, vs. why can't it do this, or I should be able to do this. Try letting go of that idea that it should be different, and try to meet yourself where you are at, without the pressure to change anything Do you bring your vaginal trainers into PT? Sometimes, bringing your trainers into sessions with your PT and having them talk you through the process while you are in control of the trainer can be helpful. Maybe they can help you find ways to make insertion a bit easier for you, or they can help remind you to breathe. If you aren't doing this, I definitely recommend talking to a PT about it. Sometimes knowing the physical tools you have can help with the mental game. One thing that can be helpful to do before jumping to dilating is called "cupping". It is just taking your hand and holding your palm to your vulva and breathing. Doing this before doing any type of trainer training can be helpful as it lets you take a second and check in, vs jumping right in. Kind of like a warm up to your workout! Try different tools and see if you like another one more. Some of our patients prefer crystal wands or plastic trainers to silicone, some prefer starting with a vibrating pelvic wand instead of a trainer, look at different options and see what interests you. You can also try different lubricants. And don't feel as though you need to jump right into inserting anything. The first few times (or minutes of the session) may just be about putting it at the entrance without inserting, and that's ok! We have also had patients find success with an external vibrator on their pubic bone while they use trainers as this can help distract nerves and increase muscle relaxation. This can be helpful to normalize the trainers. Leave the one you are currently using and the one you want to progress to somewhere you  see it every day, like the bathroom counter or the bedside table. Seeing them every day can make them less intimidating. The more you do something, the more routine it becomes, so setting a vaginal trainers schedule and sticking to it can help make it less intimidating. Last thing that could be helpful to you is to set yourself up a relaxing environment when you use your vaginal trainers. Play your favorite calming music, light your favorite candle, incense, or turn on your diffuser, wear your coziest t-shirt and socks, prop your legs on pillows, anything that feels like a big exhale. Don't distract yourself with tv or a movie. Stay tuned into your body to help maintain relaxation Listening to relaxing music or meditations can also be helpful. Preferences for guided meditations can be so different from person to person so find one you feel relaxed/safe with!  [employer] is a  great dilator company.   Copyright 2023 [employer]    Access Code: [email or web address] URL: [email or web address] Date: 10/08/2021 Prepared by: Jessica AlmKristen Ramina Potter  Exercises Supine Diaphragmatic Breathing - 3-5 x daily - 7 x weekly - 1 sets - 10 reps Happy Baby with Pelvic Floor Lengthening - 1 x daily - 7 x weekly - 1 sets - 10 reps Diaphragmatic Breathing in Child's Pose with Pelvic Floor Relaxation - 1 x daily - 7 x weekly - 1 sets - 10 reps Cat Cow - 1 x daily - 7 x weekly - 2 sets - 10 reps

## 2021-10-08 NOTE — Therapy (Signed)
[facility] [address] [place], [place], [address] Phone: [phone]   Fax:  [phone]  Physical Therapy Evaluation  Patient Details  Name: Jessica Potter MRN: [id number] Date of Birth: [birth year] Referring Provider (PT): Levie HeritageStinson, Jacob J, DO   Encounter Date: 10/08/2021   PT End of Session - 10/08/21 1100     Visit Number 1    Date for PT Re-Evaluation 12/31/21    Authorization Type [employer]    PT Start Time 1015    PT Stop Time 1059    PT Time Calculation (min) 44 min    Activity Tolerance Patient tolerated treatment well    Behavior [facility] for tasks assessed/performed             Past Medical History:  Diagnosis Date   Eczema    Flexural eczema 02/01/2017   Seasonal allergic rhinitis 04/14/2016   Vaginal Pap smear, abnormal 2020   LGSIL    Past Surgical History:  Procedure Laterality Date   NO PAST SURGERIES      There were no vitals filed for this visit.    Subjective Assessment - 10/08/21 1012     Subjective Pt states that initial penetration in intercourse is very painful. She reports this started 09/2020 and lasted for about a month and then stopped. Then around September 2022 it came back and has not gone away. She denies any changes with partners - one consistent partner. She has never had pain like this with previous partners. She has pain with tampon use. She is using water based lubricant which is helpful. Once partner has penis inserted in vagina, pain stops; when she goes to urinate after intercourse she reports burning and stinging. She states that pain will get up to 8/10. She is now unable to have an orgasm, which she was able to in the past. She has not tried to have orgasm on her. She does go to chiropractor to make sure LBP does not flare up. She does not exericse on regular basis. She does depo injections and does not have menstrual cycle - 4 years.                [facility]  Assessment - 10/08/21 0001       Assessment   Medical Diagnosis N94.10 (ICD-10-CM) - Dyspareunia in female  61N94.2 (ICD-10-CM) - Vaginismus    Referring Provider (PT) Levie HeritageStinson, Jacob J, DO    Onset Date/Surgical Date 09/14/20    Next MD Visit 10/29/21    Prior Therapy no      Precautions   Precautions None      Restrictions   Weight Bearing Restrictions No      Balance Screen   Has the patient fallen in the past 6 months No    Has the patient had a decrease in activity level because of a fear of falling?  No    Is the patient reluctant to leave their home because of a fear of falling?  No      Home [facility] information [facility] managernvironment   Living Environment Private residence      Prior Function   Level of Independence Independent      Cognition   Overall Cognitive Status Within Functional Limits for tasks assessed      Palpation   Palpation comment Abdominal tension in Bil lower qudrants with tenderness at midline - overall restriction  No emotional/communication barriers or cognitive limitation. Patient is motivated to learn. Patient understands and agrees with treatment goals and plan. PT explains patient will be examined in standing, sitting, and lying down to see how their muscles and joints work. When they are ready, they will be asked to remove their underwear so PT can examine their perineum. The patient is also given the option of providing their own chaperone as one is not provided in our facility. The patient also has the right and is explained the right to defer or refuse any part of the evaluation or treatment including the internal exam. With the patient's consent, PT will use one gloved finger to gently assess the muscles of the pelvic floor, seeing how well it contracts and relaxes and if there is muscle symmetry. After, the patient will get dressed and PT and patient will discuss exam findings and plan of care. PT and patient discuss plan of care, schedule, attendance  policy and HEP activities.      Objective measurements completed on examination: See above findings.     Pelvic Floor Special Questions - 10/08/21 0001     Prior Pelvic/Prostate Exam Yes    Are you Pregnant or attempting pregnancy? No    Prior Pregnancies No    Diastasis Recti no    Currently Sexually Active Yes    Is this Painful No    History of sexually transmitted disease No    Marinoff Scale pain interrupts completion    Urinary Leakage No    Urinary urgency Yes   sometimes- random   Urinary frequency every 2 hours    Fecal incontinence No   Constipation, chornic - 2-3 BM a week. Straining.   Fluid intake a cup a day    Caffeine beverages no    Falling out feeling (prolapse) No    Skin Integrity Intact    Scar none    Perineal Body/Introitus  Elevated    External Palpation WNL    Prolapse None    Pelvic Floor Internal Exam Pt identity confirmed and verbal consent given for internal pelvic exam    Exam Type Vaginal    Sensation WNL    Palpation 6/10 pain/tenderness at superficial pelvic floor - did not proceed further, but started treatment    Strength weak squeeze, no lift    Strength # of reps 13   not full relaxation between   Strength # of seconds 5              [facility] Adult PT Treatment/Exercise - 10/08/21 0001       Neuro Re-ed    Neuro Re-ed Details  Diaphragmatic breathing with VC/TCs for improved pelvic floor relaxation and abdominal excursion      Exercises   Exercises Lumbar      Lumbar Exercises: Supine   Other Supine Lumbar Exercises Happy baby, 10 breaths, down training, VC/TCs for diaphragmatic breathing      Lumbar Exercises: Quadruped   Madcat/Old Horse 15 reps   breath coordiantion; down training   Other Quadruped Lumbar Exercises Child's pose 10 breaths; down training; VC/TCs for diaphragmatic breathing      Manual Therapy   Manual Therapy Internal Pelvic Floor    Internal Pelvic Floor Superficial pelvic floor desensitization                        PT Short Term Goals - 10/08/21 1210       PT SHORT TERM GOAL #  1   Title Pt will be independent with HEP.    Time 4    Period Weeks    Status New    Target Date 11/05/21      PT SHORT TERM GOAL #2   Title Pt will be independent and demonstrate appropriate form with diaphragmatic breathing and pelvic floor muscle bulge in order to improve pelvic floor relaxation.    Time 4    Period Weeks    Status New    Target Date 11/05/21               PT Long Term Goals - 10/08/21 1211       PT LONG TERM GOAL #1   Title Pt will be independent with advanced HEP.    Time 12    Period Weeks    Status New    Target Date 12/31/21      PT LONG TERM GOAL #2   Title Pt will be able to tolerate vaginal insertion of tampon without discomfort.    Time 12    Period Weeks    Status New    Target Date 12/31/21      PT LONG TERM GOAL #3   Title Pt will be able to achieve orgasm without discomfort.    Time 12    Period Weeks    Status New    Target Date 12/31/21      PT LONG TERM GOAL #4   Title Pt will be able to have pain free vaginal penetration during intercourse in order to enjoy intimate relationship with partner.    Time 12    Period Weeks    Status New    Target Date 12/31/21                    Plan - 10/08/21 1100     Clinical Impression Statement Pt is a 26 year old female with chief complaint of dyspareunia since January 2022 that worsened September 2022 without any changes to partner, lifestyle, or medication; she does report constipation that may be associated with increased pelvic floor tension. Exam fidnings notable for increased Bil lower abdominal restriction with tenderness palpating uterus, elevated perineal body, increased superficial pelvic floor tension with report of burning/stinging with light tough, pelvic floor weakness 2/5, and decreased pelvic floor endurance 5 seconds; due to level of discomfort, we did not progress  to deep pelvic floor assessment. Signs and symptoms are most consistent with primary vaginismus and poor pelvic floor awareness/coordination. Due to history of painful and very heavy menstrual cycles and restricted/tender abdomen, possibly consider endometriosis; if she does not see progress with conservative treatment, she may want to consider working with MD on birth control changes to see if it is helpful. Initial treatment consisted of down training exercises, diaphragmatic breathing, and superficial pelvic floor muscle desenstiziation; she tolerated all well demosntrated by improved relaxation and decreased pain withsuperficial pelvic floor palpation. She will benefit from skilled PT intervention in order to address impairments, decrease dyspareunia, and meet all goals.    Personal Factors and Comorbidities Comorbidity 1    Comorbidities Constipation    Examination-Activity Limitations Other   vaignal penetration/intercourse   Examination-Participation Restrictions Interpersonal Relationship    Stability/Clinical Decision Making Stable/Uncomplicated    Clinical Decision Making Low    Rehab Potential Good    PT Frequency 1x / week    PT Duration 12 weeks    PT Treatment/Interventions ADLs/Self Care Home Management;Biofeedback;Cryotherapy;Electrical Stimulation;Moist Heat;Therapeutic activities;Therapeutic  exercise;Neuromuscular re-education;Manual techniques;Patient/family education;Passive range of motion;Dry needling;Spinal Manipulations    PT Next Visit Plan Plan to continue pelvic floor desensitization; further discuss dilators and possible benefit; progress down training and abdominal mobility.    PT Home Exercise Plan [email or web address]    Consulted and Agree with Plan of Care Patient             Patient will benefit from skilled therapeutic intervention in order to improve the following deficits and impairments:  Decreased coordination, Increased fascial restricitons, Impaired tone, Decreased  endurance, Pain, Increased muscle spasms, Decreased mobility, Decreased strength  Visit Diagnosis: Other muscle spasm  Unspecified lack of coordination     Problem List Patient Active Problem List   Diagnosis Date Noted   Allergic conjunctivitis of both eyes 03/12/2021   Oral allergy syndrome, subsequent encounter 03/12/2021   Precordial pain 10/03/2020   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 [place], [place], [address] Phone: [phone]   Fax:  [phone]  Name: Jessica Potter MRN: [id number] Date of Birth: May 20, 1996

## 2021-10-29 ENCOUNTER — Encounter: Payer: Self-pay | Admitting: Obstetrics & Gynecology

## 2021-10-29 ENCOUNTER — Other Ambulatory Visit: Payer: Self-pay

## 2021-10-29 ENCOUNTER — Ambulatory Visit: Payer: 59 | Attending: Family Medicine

## 2021-10-29 ENCOUNTER — Ambulatory Visit: Payer: 59 | Admitting: Obstetrics & Gynecology

## 2021-10-29 VITALS — BP 111/67 | HR 97 | Wt 134.0 lb

## 2021-10-29 DIAGNOSIS — N941 Unspecified dyspareunia: Secondary | ICD-10-CM | POA: Diagnosis not present

## 2021-10-29 DIAGNOSIS — M62838 Other muscle spasm: Secondary | ICD-10-CM | POA: Insufficient documentation

## 2021-10-29 DIAGNOSIS — R279 Unspecified lack of coordination: Secondary | ICD-10-CM | POA: Insufficient documentation

## 2021-10-29 NOTE — Progress Notes (Signed)
Patient reports improvement in dyparenea. Armandina Stammer RN

## 2021-10-29 NOTE — Therapy (Signed)
[facility] @ [facility] [address] [place], [place], [address] Phone: [phone]   Fax:  [phone]  Physical Therapy Treatment  Patient Details  Name: Jessica Potter MRN: [id number] Date of Birth: 25-Feb-1996 Referring Provider (PT): Levie Heritage, DO   Encounter Date: 10/29/2021   PT End of Session - 10/29/21 1153     Visit Number 2    Date for PT Re-Evaluation 12/31/21    Authorization Type UHC    PT Start Time 1148    PT Stop Time 1226    PT Time Calculation (min) 38 min    Activity Tolerance Patient tolerated treatment well    Behavior During Therapy [facility] for tasks assessed/performed             Past Medical History:  Diagnosis Date   Eczema    Flexural eczema 02/01/2017   Seasonal allergic rhinitis 04/14/2016   Vaginal Pap smear, abnormal 2020   LGSIL    Past Surgical History:  Procedure Laterality Date   NO PAST SURGERIES      There were no vitals filed for this visit.   Subjective Assessment - 10/29/21 1148     Subjective Pt states that she feels like pain with intercourse is getting better, specifically associated with insertion. She has been working on HEP dialy. She does feel like breathing is helpful when she is being intimate with reducing pain. She has looked at dilators, but feels like it would be diffiuclt to purchase due to expensive.    Patient Stated Goals To reduce pain with intercourse.    Currently in Pain? No/denies    Multiple Pain Sites No                       No emotional/communication barriers or cognitive limitation. Patient is motivated to learn. Patient understands and agrees with treatment goals and plan. PT explains patient will be examined in standing, sitting, and lying down to see how their muscles and joints work. When they are ready, they will be asked to remove their underwear so PT can examine their perineum. The patient is also given the option of providing  their own chaperone as one is not provided in our facility. The patient also has the right and is explained the right to defer or refuse any part of the evaluation or treatment including the internal exam. With the patient's consent, PT will use one gloved finger to gently assess the muscles of the pelvic floor, seeing how well it contracts and relaxes and if there is muscle symmetry. After, the patient will get dressed and PT and patient will discuss exam findings and plan of care. PT and patient discuss plan of care, schedule, attendance policy and HEP activities.         [facility] Adult PT Treatment/Exercise - 10/29/21 0001       Self-Care   Self-Care Other Self-Care Comments   working with partner on desensitization; vulvovaginal massage     Lumbar Exercises: Supine   Other Supine Lumbar Exercises Butterfly 10 breaths      Lumbar Exercises: Sidelying   Other Sidelying Lumbar Exercises Open books 8x bil      Lumbar Exercises: Prone   Other Prone Lumbar Exercises cobra 5x      Manual Therapy   Manual Therapy Internal Pelvic Floor;Soft tissue mobilization    Soft tissue mobilization abdoinal soft tissue mobilizatoin all 4 quadrant    Internal Pelvic Floor Superficial  pelvic floor desensitization                     PT Education - 10/29/21 1227     Education Details Pt education on vulvovaginal massage, working with partner on desensitization, and new exercises included in HEP; we discussed progress after initial visit.    Person(s) Educated Patient    Methods Explanation;Demonstration;Tactile cues;Verbal cues;Handout    Comprehension Verbalized understanding              PT Short Term Goals - 10/08/21 1210       PT SHORT TERM GOAL #1   Title Pt will be independent with HEP.    Time 4    Period Weeks    Status New    Target Date 11/05/21      PT SHORT TERM GOAL #2   Title Pt will be independent and demonstrate appropriate form with diaphragmatic breathing  and pelvic floor muscle bulge in order to improve pelvic floor relaxation.    Time 4    Period Weeks    Status New    Target Date 11/05/21               PT Long Term Goals - 10/08/21 1211       PT LONG TERM GOAL #1   Title Pt will be independent with advanced HEP.    Time 12    Period Weeks    Status New    Target Date 12/31/21      PT LONG TERM GOAL #2   Title Pt will be able to tolerate vaginal insertion of tampon without discomfort.    Time 12    Period Weeks    Status New    Target Date 12/31/21      PT LONG TERM GOAL #3   Title Pt will be able to achieve orgasm without discomfort.    Time 12    Period Weeks    Status New    Target Date 12/31/21      PT LONG TERM GOAL #4   Title Pt will be able to have pain free vaginal penetration during intercourse in order to enjoy intimate relationship with partner.    Time 12    Period Weeks    Status New    Target Date 12/31/21                   Plan - 10/29/21 1153     Clinical Impression Statement Good initial response to down training exercises with some improvement in pain with insertion. She did well with all down training progressions this session prior to internal pelvic floor desensitization; good tolerance to all progressions with comfortable stretch, good breath coodination, and overall increase in relaxation. Improvements in abdominal restrictionfrom initial evaluation, likely due to working on diaphragmatic breathing and exercises regularly. Internal pelvic floor manual desensitization techniques demonstrated good improvements in superficial muscle spasm and hypersensitivity; she was able to tolerated light sustained pressure to help muscles further relax and wait for any increase in discomfort to subside. Pain subsided very quickly with diaphragmatic breathing and pt felt confident about work performed today. She will continue to benefit from skilled PT intervention in order to progress pelvic floor  desensitization.    PT Treatment/Interventions ADLs/Self Care Home Management;Biofeedback;Cryotherapy;Electrical Stimulation;Moist Heat;Therapeutic activities;Therapeutic exercise;Neuromuscular re-education;Manual techniques;Patient/family education;Passive range of motion;Dry needling;Spinal Manipulations    PT Next Visit Plan Continue pelvic floor desensitization; make this a priority of treatments since patient  is unable to purchase dilators at this time; contiue down training and possibly begin gentle strengthening.    PT Home Exercise Plan [email or web address]    Consulted and Agree with Plan of Care Patient             Patient will benefit from skilled therapeutic intervention in order to improve the following deficits and impairments:  Decreased coordination, Increased fascial restricitons, Impaired tone, Decreased endurance, Pain, Increased muscle spasms, Decreased mobility, Decreased strength  Visit Diagnosis: Unspecified lack of coordination  Other muscle spasm     Problem List Patient Active Problem List   Diagnosis Date Noted   Allergic conjunctivitis of both eyes 03/12/2021   Oral allergy syndrome, subsequent encounter 03/12/2021   Precordial pain 10/03/2020   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

## 2021-10-29 NOTE — Patient Instructions (Addendum)
Vulvar/vaginal Massage: This is a technique to help decrease painful sensitivity in the vaginal area. It can also help to restore normal moisture levels in the vaginal tissues. With coconut oil, aloe, jojoba oil, or a specific vaginal moisturizer, gently massage into vaginal tissues. Think of this as part of your post-shower routine and moisturizing just like you would the rest of the body with lotion. This helps to increase good blood flow to the vaginal tissues. In addition, it also teaches the body that touch to the vagina does not have to be painful or threatening, but moisturizing and gentle.  Access Code: [email or web address] URL: [email or web address] Date: 10/29/2021 Prepared by: Julio Alm  Exercises Supine Diaphragmatic Breathing - 3-5 x daily - 7 x weekly - 1 sets - 10 reps Happy Baby with Pelvic Floor Lengthening - 1 x daily - 7 x weekly - 1 sets - 10 reps Diaphragmatic Breathing in Child's Pose with Pelvic Floor Relaxation - 1 x daily - 7 x weekly - 1 sets - 10 reps Cat Cow - 1 x daily - 7 x weekly - 2 sets - 10 reps Sidelying Open Book Thoracic Lumbar Rotation and Extension - 1 x daily - 7 x weekly - 3 sets - 10 reps Cobra - 1 x daily - 7 x weekly - 3 sets - 10 reps Supine Butterfly Groin Stretch - 1 x daily - 7 x weekly - 3 sets - 10 reps

## 2021-10-29 NOTE — Progress Notes (Signed)
History:  26 y.o. G0P0000 here today for f/u of dyspareunia. She reports that her sx have imp[oprved markedly with 2 visits at PT. She plans to cont PT.      The following portions of the patient's history were reviewed and updated as appropriate: allergies, current medications, past family history, past medical history, past social history, past surgical history and problem list.  Review of Systems:  Pertinent items are noted in HPI.    Objective:  Physical Exam Blood pressure 111/67, pulse 97, weight 134 lb (60.8 kg).  CONSTITUTIONAL: Well-developed, well-nourished female in no acute distress.  HENT:  Normocephalic, atraumatic EYES: Conjunctivae and EOM are normal. No scleral icterus.  NECK: Normal range of motion SKIN: Skin is warm and dry. No rash noted. Not diaphoretic.No pallor. NEUROLGIC: Alert and oriented to person, place, and time. Normal coordination.  Abd: Soft, nontender and nondistended Pelvic: deferred   Assessment & Plan:  Dyspareunia- improved with PT.   Keep PT and f/u prn  Karole Oo L. Harraway-Smith, M.D., [facility]

## 2021-11-05 ENCOUNTER — Ambulatory Visit: Payer: 59

## 2021-11-05 ENCOUNTER — Telehealth: Payer: Self-pay

## 2021-11-05 ENCOUNTER — Other Ambulatory Visit: Payer: Self-pay

## 2021-11-05 DIAGNOSIS — M62838 Other muscle spasm: Secondary | ICD-10-CM

## 2021-11-05 DIAGNOSIS — R279 Unspecified lack of coordination: Secondary | ICD-10-CM | POA: Diagnosis not present

## 2021-11-05 NOTE — Therapy (Signed)
[facility] @ [place], [place], [address] Phone: [phone]   Fax:  [phone]  Physical Therapy Treatment  Patient Details  Name: Jessica Potter MRN: [id number] Date of Birth: 06-14-1996 Referring Provider (PT): Truett Mainland, DO   Encounter Date: 11/05/2021   PT End of Session - 11/05/21 1016     Visit Number 3    Date for PT Re-Evaluation 12/31/21    Authorization Type UHC    PT Start Time 1016    PT Stop Time 1052    PT Time Calculation (min) 36 min    Activity Tolerance Patient tolerated treatment well    Behavior During Therapy [facility] for tasks assessed/performed             Past Medical History:  Diagnosis Date   Eczema    Flexural eczema 02/01/2017   Seasonal allergic rhinitis 04/14/2016   Vaginal Pap smear, abnormal 2020   LGSIL    Past Surgical History:  Procedure Laterality Date   NO PAST SURGERIES      There were no vitals filed for this visit.   Subjective Assessment - 11/05/21 1016     Subjective Pt states that she is doing well overall. She denies any soreness after last treatment session. She has intercourse in the last week and states that it is about 30% better.    Patient Stated Goals To reduce pain with intercourse.    Currently in Pain? No/denies    Multiple Pain Sites No                     No emotional/communication barriers or cognitive limitation. Patient is motivated to learn. Patient understands and agrees with treatment goals and plan. PT explains patient will be examined in standing, sitting, and lying down to see how their muscles and joints work. When they are ready, they will be asked to remove their underwear so PT can examine their perineum. The patient is also given the option of providing their own chaperone as one is not provided in our facility. The patient also has the right and is explained the right to defer or refuse any part of the evaluation  or treatment including the internal exam. With the patient's consent, PT will use one gloved finger to gently assess the muscles of the pelvic floor, seeing how well it contracts and relaxes and if there is muscle symmetry. After, the patient will get dressed and PT and patient will discuss exam findings and plan of care. PT and patient discuss plan of care, schedule, attendance policy and HEP activities.           [facility] PT Treatment/Exercise - 11/05/21 0001       Lumbar Exercises: Stretches   Active Hamstring Stretch Right;Left;1 rep;60 seconds    Pelvic Tilt 20 reps    Piriformis Stretch Right;Left;1 rep;60 seconds      Manual Therapy   Manual Therapy Internal Pelvic Floor;Soft tissue mobilization    Soft tissue mobilization abdoinal soft tissue mobilizatoin all 4 quadrant    Internal Pelvic Floor Superficial pelvic floor desensitization                     PT Education - 11/05/21 1045     Education Details Pt education performed on calling MD about topical estrogen cream; discussed including partner with desensitization, especially prior to any intercourse attempts.    Person(s) Educated Patient  Methods Explanation;Tactile cues;Demonstration;Verbal cues    Comprehension Verbalized understanding              PT Short Term Goals - 10/08/21 1210       PT SHORT TERM GOAL #1   Title Pt will be independent with HEP.    Time 4    Period Weeks    Status New    Target Date 11/05/21      PT SHORT TERM GOAL #2   Title Pt will be independent and demonstrate appropriate form with diaphragmatic breathing and pelvic floor muscle bulge in order to improve pelvic floor relaxation.    Time 4    Period Weeks    Status New    Target Date 11/05/21               PT Long Term Goals - 10/08/21 1211       PT LONG TERM GOAL #1   Title Pt will be independent with advanced HEP.    Time 12    Period Weeks    Status New    Target Date 12/31/21      PT  LONG TERM GOAL #2   Title Pt will be able to tolerate vaginal insertion of tampon without discomfort.    Time 12    Period Weeks    Status New    Target Date 12/31/21      PT LONG TERM GOAL #3   Title Pt will be able to achieve orgasm without discomfort.    Time 12    Period Weeks    Status New    Target Date 12/31/21      PT LONG TERM GOAL #4   Title Pt will be able to have pain free vaginal penetration during intercourse in order to enjoy intimate relationship with partner.    Time 12    Period Weeks    Status New    Target Date 12/31/21                   Plan - 11/05/21 1018     Clinical Impression Statement Pt doing well with reported 30% improvement in the first several weeks of treatment. We discussed importance of working with partner on desensitization, especially just prior to intercourse due to quick release/improvement in discomfort of superficial vaginal muscles. She demonstrated quick improvement in tension and pain with manual techniques today in the superficial muscles; she did have slightly more tension present in Rt levator ani that did not release quite as quickly. Due to restriction, hip stretches and pelvic tilts gien to help improve mobility. She will continue to benefit from skilled PT intervention in order to progress pelvic floor desensitization.    PT Treatment/Interventions ADLs/Self Care Home Management;Biofeedback;Cryotherapy;Electrical Stimulation;Moist Heat;Therapeutic activities;Therapeutic exercise;Neuromuscular re-education;Manual techniques;Patient/family education;Passive range of motion;Dry needling;Spinal Manipulations    PT Next Visit Plan Continue pelvic floor desensitization; make this a priority of treatments since patient is unable to purchase dilators at this time; contiue down training and possibly begin gentle strengthening.    PT Home Exercise Plan [email or web address]    Consulted and Agree with Plan of Care Patient             Patient  will benefit from skilled therapeutic intervention in order to improve the following deficits and impairments:  Decreased coordination, Increased fascial restricitons, Impaired tone, Decreased endurance, Pain, Increased muscle spasms, Decreased mobility, Decreased strength  Visit Diagnosis: Unspecified lack of coordination  Other muscle spasm  Problem List Patient Active Problem List   Diagnosis Date Noted   Allergic conjunctivitis of both eyes 03/12/2021   Oral allergy syndrome, subsequent encounter 03/12/2021   Precordial pain 10/03/2020   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

## 2021-11-05 NOTE — Telephone Encounter (Signed)
Patient called and finished her pelvic PT therapy session today. Patient states that PT is suggesting estrogen cream to help patient. Will route to provider. Armandina Stammer RN

## 2021-11-07 ENCOUNTER — Other Ambulatory Visit: Payer: Self-pay | Admitting: Obstetrics & Gynecology

## 2021-11-07 ENCOUNTER — Encounter: Payer: Self-pay | Admitting: Obstetrics & Gynecology

## 2021-11-07 ENCOUNTER — Other Ambulatory Visit (HOSPITAL_BASED_OUTPATIENT_CLINIC_OR_DEPARTMENT_OTHER): Payer: Self-pay

## 2021-11-07 DIAGNOSIS — F525 Vaginismus not due to a substance or known physiological condition: Secondary | ICD-10-CM

## 2021-11-07 MED ORDER — ESTRADIOL 0.1 MG/GM VA CREA
1.0000 g | TOPICAL_CREAM | VAGINAL | 2 refills | Status: DC
  Filled 2021-11-07: qty 42.5, 90d supply, fill #0

## 2021-11-10 ENCOUNTER — Other Ambulatory Visit (HOSPITAL_BASED_OUTPATIENT_CLINIC_OR_DEPARTMENT_OTHER): Payer: Self-pay

## 2021-11-12 ENCOUNTER — Other Ambulatory Visit: Payer: Self-pay

## 2021-11-12 ENCOUNTER — Ambulatory Visit: Payer: 59 | Attending: Family Medicine

## 2021-11-12 DIAGNOSIS — M62838 Other muscle spasm: Secondary | ICD-10-CM | POA: Insufficient documentation

## 2021-11-12 DIAGNOSIS — R279 Unspecified lack of coordination: Secondary | ICD-10-CM | POA: Diagnosis present

## 2021-11-12 NOTE — Therapy (Signed)
[place] ?[facility] ?[address] ?[place], [place], [address] ?Phone: [phone]   Fax:  [phone] ? ?Physical Therapy Treatment ? ?Patient Details  ?Name: Jessica Potter ?MRN: [id number] ?Date of Birth: 10-01-1995 ?Referring Provider (PT): Levie Heritage, DO ? ? ?Encounter Date: 11/12/2021 ? ? PT End of Session - 11/12/21 1016   ? ? Visit Number 4   ? Date for PT Re-Evaluation 12/31/21   ? Authorization Type UHC   ? PT Start Time 1016   ? PT Stop Time 1054   ? PT Time Calculation (min) 38 min   ? Activity Tolerance Patient tolerated treatment well   ? Behavior During Therapy [facility] for tasks assessed/performed   ? ?  ?  ? ?  ? ? ?Past Medical History:  ?Diagnosis Date  ? Eczema   ? Flexural eczema 02/01/2017  ? Seasonal allergic rhinitis 04/14/2016  ? Vaginal Pap smear, abnormal 2020  ? LGSIL  ? ? ?Past Surgical History:  ?Procedure Laterality Date  ? NO PAST SURGERIES    ? ? ?There were no vitals filed for this visit. ? ? Subjective Assessment - 11/12/21 1017   ? ? Subjective Pt states that she did well after last treatment session. She has talked to partner about helping with desensitization and reports that he feels uncomfortable doing this.   ? Patient Stated Goals To reduce pain with intercourse.   ? Currently in Pain? No/denies   ? Multiple Pain Sites No   ? ?  ?  ? ?  ? ? ? ? ? ? ? ?No emotional/communication barriers or cognitive limitation. Patient is motivated to learn. Patient understands and agrees with treatment goals and plan. PT explains patient will be examined in standing, sitting, and lying down to see how their muscles and joints work. When they are ready, they will be asked to remove their underwear so PT can examine their perineum. The patient is also given the option of providing their own chaperone as one is not provided in our facility. The patient also has the right and is explained the right to defer or refuse any part of the evaluation or  treatment including the internal exam. With the patient's consent, PT will use one gloved finger to gently assess the muscles of the pelvic floor, seeing how well it contracts and relaxes and if there is muscle symmetry. After, the patient will get dressed and PT and patient will discuss exam findings and plan of care. PT and patient discuss plan of care, schedule, attendance policy and HEP activities. ? ? ? ? ? ? ? ? ? ? ? ? ? [facility] Adult PT Treatment/Exercise - 11/12/21 0001   ? ?  ? Self-Care  ? Self-Care Other Self-Care Comments   dilator alternatives (produce aisle) - reviewed how to use as vaginal trainers  ?  ? Neuro Re-ed   ? Neuro Re-ed Details  pelvic floor muscle bulge and A/ROM with diaphragmatic breathing and multimodal cues   ?  ? Lumbar Exercises: Prone  ? Other Prone Lumbar Exercises Crescent lunge stretch 2 x 60 sec bil; pigeon pose 2 min bil   ?  ? Lumbar Exercises: Quadruped  ? Madcat/Old Horse 20 reps   ? Other Quadruped Lumbar Exercises thread the needle 10x bil   ?  ? Manual Therapy  ? Manual Therapy Internal Pelvic Floor;Soft tissue mobilization   ? Soft tissue mobilization abdominal soft tissue mobilizatoin all 4 quadrant; bladder mobilization   ?  Internal Pelvic Floor Superficial pelvic floor desensitization; urethral mobilization bil   ? ?  ?  ? ?  ? ? ? ? ? ? ? ? ? ? PT Education - 11/12/21 1025   ? ? Education Details Pt educatio nperformed on dilator alternatives and we reviewed how to use.   ? Person(s) Educated Patient   ? Methods Explanation;Demonstration;Tactile cues;Verbal cues;Handout   ? Comprehension Verbalized understanding   ? ?  ?  ? ?  ? ? ? PT Short Term Goals - 10/08/21 1210   ? ?  ? PT SHORT TERM GOAL #1  ? Title Pt will be independent with HEP.   ? Time 4   ? Period Weeks   ? Status New   ? Target Date 11/05/21   ?  ? PT SHORT TERM GOAL #2  ? Title Pt will be independent and demonstrate appropriate form with diaphragmatic breathing and pelvic floor muscle bulge in order to  improve pelvic floor relaxation.   ? Time 4   ? Period Weeks   ? Status New   ? Target Date 11/05/21   ? ?  ?  ? ?  ? ? ? ? PT Long Term Goals - 10/08/21 1211   ? ?  ? PT LONG TERM GOAL #1  ? Title Pt will be independent with advanced HEP.   ? Time 12   ? Period Weeks   ? Status New   ? Target Date 12/31/21   ?  ? PT LONG TERM GOAL #2  ? Title Pt will be able to tolerate vaginal insertion of tampon without discomfort.   ? Time 12   ? Period Weeks   ? Status New   ? Target Date 12/31/21   ?  ? PT LONG TERM GOAL #3  ? Title Pt will be able to achieve orgasm without discomfort.   ? Time 12   ? Period Weeks   ? Status New   ? Target Date 12/31/21   ?  ? PT LONG TERM GOAL #4  ? Title Pt will be able to have pain free vaginal penetration during intercourse in order to enjoy intimate relationship with partner.   ? Time 12   ? Period Weeks   ? Status New   ? Target Date 12/31/21   ? ?  ?  ? ?  ? ? ? ? ? ? ? ? Plan - 11/12/21 1025   ? ? Clinical Impression Statement Pt doing well with HEP and is not reporting any residual soreness after sessions. However, we are having to discuss creative ways of being able to perform more pelvic floor desensitization at home; pt is open to using a carrot with condom in order to be able to perform more regularly. Good tolerance to all progressions of down training and mobility exercises with no increase in pain and good form. Pelvic floor hypersensitivity is steadily improving, but she did present with more stinging at superficial 11/1 o'clock, but good reduction with mobilization. She will continue to benefit from skilled PT intervention in order to progress pelvic floor desensitization.   ? PT Treatment/Interventions ADLs/Self Care Home Management;Biofeedback;Cryotherapy;Electrical Stimulation;Moist Heat;Therapeutic activities;Therapeutic exercise;Neuromuscular re-education;Manual techniques;Patient/family education;Passive range of motion;Dry needling;Spinal Manipulations   ? PT Next  Visit Plan Continue pelvic floor desensitization; make this a priority of treatments since patient is unable to purchase dilators at this time; contiue down training and possibly begin gentle strengthening.   ? PT Home Exercise Plan [email or web address]   ?  Consulted and Agree with Plan of Care Patient   ? ?  ?  ? ?  ? ? ?Patient will benefit from skilled therapeutic intervention in order to improve the following deficits and impairments:  Decreased coordination, Increased fascial restricitons, Impaired tone, Decreased endurance, Pain, Increased muscle spasms, Decreased mobility, Decreased strength ? ?Visit Diagnosis: ?Unspecified lack of coordination ? ?Other muscle spasm ? ? ? ? ?Problem List ?Patient Active Problem List  ? Diagnosis Date Noted  ? Allergic conjunctivitis of both eyes 03/12/2021  ? Oral allergy syndrome, subsequent encounter 03/12/2021  ? Precordial pain 10/03/2020  ? 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 Eczema   ? Vaginal Pap smear, abnormal 2020  ? Flexural eczema 02/01/2017  ? Other allergic rhinitis 04/14/2016  ? ? ?Julio Alm, PT, DPT03/09/2308:55 AM ? ? ?[place] ?[facility] ?[address] ?[place], [place], [address] ?Phone: [phone]   Fax:  [phone] ? ?Name: Delora Demanche ?MRN: [id number] ?Date of Birth: Jan 15, 1996 ? ? ? ?

## 2021-11-12 NOTE — Patient Instructions (Signed)
Access Code: [email or web address] ?URL: [email or web address] ?Date: 11/12/2021 ?Prepared by: Julio Alm ? ?Exercises ?Supine Diaphragmatic Breathing - 3-5 x daily - 7 x weekly - 1 sets - 10 reps ?Happy Baby with Pelvic Floor Lengthening - 1 x daily - 7 x weekly - 1 sets - 10 reps ?Diaphragmatic Breathing in Child's Pose with Pelvic Floor Relaxation - 1 x daily - 7 x weekly - 1 sets - 10 reps ?Cat Cow - 1 x daily - 7 x weekly - 2 sets - 10 reps ?Sidelying Open Book Thoracic Lumbar Rotation and Extension - 1 x daily - 7 x weekly - 3 sets - 10 reps ?Cobra - 1 x daily - 7 x weekly - 3 sets - 10 reps ?Supine Butterfly Groin Stretch - 1 x daily - 7 x weekly - 3 sets - 10 reps ?Dynamic Warm Up Lunge Stretch - 1 x daily - 7 x weekly - 1 sets - 2 reps ?Pigeon Pose - 1 x daily - 7 x weekly - 1 sets - 2 reps - 60 hold ?Quadruped Full Range Thoracic Rotation with Reach - 1 x daily - 7 x weekly - 1 sets - 10 reps ? ?

## 2021-11-19 ENCOUNTER — Other Ambulatory Visit: Payer: Self-pay

## 2021-11-19 ENCOUNTER — Ambulatory Visit: Payer: 59

## 2021-11-19 DIAGNOSIS — M62838 Other muscle spasm: Secondary | ICD-10-CM

## 2021-11-19 DIAGNOSIS — R279 Unspecified lack of coordination: Secondary | ICD-10-CM | POA: Diagnosis not present

## 2021-11-19 NOTE — Therapy (Signed)
[place] ?[facility] [place] [facility] [place] ?[place], [place], [address] ?Phone: [phone]   Fax:  [phone] ? ?Physical Therapy Treatment ? ?Patient Details  ?Name: Jessica Potter ?MRN: [id number] ?Date of Birth: 1996-02-08 ?Referring Provider (PT): Truett Mainland, DO ? ? ?Encounter Date: 11/19/2021 ? ? PT End of Session - 11/19/21 1015   ? ? Visit Number 5   ? Date for PT Re-Evaluation 12/31/21   ? Authorization Type UHC   ? PT Start Time 1015   ? PT Stop Time 1055   ? PT Time Calculation (min) 40 min   ? Activity Tolerance Patient tolerated treatment well   ? Behavior During Therapy [facility] for tasks assessed/performed   ? ?  ?  ? ?  ? ? ?Past Medical History:  ?Diagnosis Date  ? Eczema   ? Flexural eczema 02/01/2017  ? Seasonal allergic rhinitis 04/14/2016  ? Vaginal Pap smear, abnormal 2020  ? LGSIL  ? ? ?Past Surgical History:  ?Procedure Laterality Date  ? NO PAST SURGERIES    ? ? ?There were no vitals filed for this visit. ? ? Subjective Assessment - 11/19/21 1016   ? ? Subjective Pt states that she has been working on pelvic floor desensitization at home using carrot. She states that the same spots feel sore, but she feels like it is starting to get better. She reports all other exercises are going well. She has not been intimate with partner at all in the last week. She feels like she is 50% better.   ? Patient Stated Goals To reduce pain with intercourse.   ? Currently in Pain? No/denies   ? Multiple Pain Sites No   ? ?  ?  ? ?  ? ? ? ? ?No emotional/communication barriers or cognitive limitation. Patient is motivated to learn. Patient understands and agrees with treatment goals and plan. PT explains patient will be examined in standing, sitting, and lying down to see how their muscles and joints work. When they are ready, they will be asked to remove their underwear so PT can examine their perineum. The patient is also given the option of providing their own chaperone as  one is not provided in our facility. The patient also has the right and is explained the right to defer or refuse any part of the evaluation or treatment including the internal exam. With the patient's consent, PT will use one gloved finger to gently assess the muscles of the pelvic floor, seeing how well it contracts and relaxes and if there is muscle symmetry. After, the patient will get dressed and PT and patient will discuss exam findings and plan of care. PT and patient discuss plan of care, schedule, attendance policy and HEP activities. ? ? ? ? ? ? ? ? ? ? ? ? ? ? ? ? [place] Adult PT Treatment/Exercise - 11/19/21 0001   ? ?  ? Lumbar Exercises: Stretches  ? Piriformis Stretch Right;Left;1 rep;60 seconds   seated  ?  ? Lumbar Exercises: Seated  ? Other Seated Lumbar Exercises Seated lower trunk rotation 2 x 10   ?  ? Lumbar Exercises: Sidelying  ? Other Sidelying Lumbar Exercises Open books 8x bil   ?  ? Lumbar Exercises: Prone  ? Other Prone Lumbar Exercises Crescent lunge stretch 2 x 60 sec bil; pigeon pose 2 min bil; frog 2 x 60 sec   ?  ? Manual Therapy  ? Manual Therapy Internal Pelvic  Floor   ? Internal Pelvic Floor Superficial pelvic floor desensitization; urethral mobilization bil   ? ?  ?  ? ?  ? ? ? ? ? ? ? ? ? ? PT Education - 11/19/21 1023   ? ? Education Details Pt education performed on plan moving forward with appointments and decreasing frequency since she is doing well and has good tools to continue progress at home.   ? Person(s) Educated Patient   ? Methods Explanation;Demonstration;Tactile cues;Verbal cues;Handout   ? Comprehension Verbalized understanding   ? ?  ?  ? ?  ? ? ? PT Short Term Goals - 11/19/21 1026   ? ?  ? PT SHORT TERM GOAL #1  ? Title Pt will be independent with HEP.   ? Time 4   ? Period Weeks   ? Status Achieved   ? Target Date 11/05/21   ?  ? PT SHORT TERM GOAL #2  ? Title Pt will be independent and demonstrate appropriate form with diaphragmatic breathing and pelvic floor  muscle bulge in order to improve pelvic floor relaxation.   ? Time 4   ? Period Weeks   ? Status Achieved   ? Target Date 11/05/21   ? ?  ?  ? ?  ? ? ? ? PT Long Term Goals - 11/19/21 1026   ? ?  ? PT LONG TERM GOAL #1  ? Title Pt will be independent with advanced HEP.   ? Time 12   ? Period Weeks   ? Status Partially Met   ? Target Date 12/31/21   ?  ? PT LONG TERM GOAL #2  ? Title Pt will be able to tolerate vaginal insertion of tampon without discomfort.   ? Time 12   ? Period Weeks   ? Status Partially Met   ? Target Date 12/31/21   ?  ? PT LONG TERM GOAL #3  ? Title Pt will be able to achieve orgasm without discomfort.   ? Time 12   ? Period Weeks   ? Status Partially Met   ? Target Date 12/31/21   ?  ? PT LONG TERM GOAL #4  ? Title Pt will be able to have pain free vaginal penetration during intercourse in order to enjoy intimate relationship with partner.   ? Time 12   ? Period Weeks   ? Status Partially Met   ? Target Date 12/31/21   ? ?  ?  ? ?  ? ? ? ? ? ? ? ? Plan - 11/19/21 1024   ? ? Clinical Impression Statement Pt is making good progress and feels like she is 50% better. She has been working on [employer] floor desensitization more at home with good tolerance and improvements. Sh edid well with all mobility and down training exercises today to help prepare for pelvic floor desensitization; she is demonstrating improved mobility and tolerance to static positions. Good improvements in pelvic floor discomfort superficially; we spent more time with deeper muscle release today due to soreness in this area; she is also reporting return to increased consitpation and straining that could be related to this and then the tightness is also feeding the consitpation forward. Good improvements in discomfort throughout session. She will continue to benefit from skilled PT intervention in order to progress pelvic floor desensitization.   ? PT Treatment/Interventions ADLs/Self Care Home  Management;Biofeedback;Cryotherapy;Electrical Stimulation;Moist Heat;Therapeutic activities;Therapeutic exercise;Neuromuscular re-education;Manual techniques;Patient/family education;Passive range of motion;Dry needling;Spinal Manipulations   ? PT  Next Visit Plan Continue pelvic floor desensitization; make this a priority of treatments since patient is unable to purchase dilators at this time; contiue down training and possibly begin gentle strengthening.   ? PT Home Exercise Plan [email or web address]   ? Consulted and Agree with Plan of Care Patient   ? ?  ?  ? ?  ? ? ?Patient will benefit from skilled therapeutic intervention in order to improve the following deficits and impairments:  Decreased coordination, Increased fascial restricitons, Impaired tone, Decreased endurance, Pain, Increased muscle spasms, Decreased mobility, Decreased strength ? ?Visit Diagnosis: ?Unspecified lack of coordination ? ?Other muscle spasm ? ? ? ? ?Problem List ?Patient Active Problem List  ? Diagnosis Date Noted  ? Allergic conjunctivitis of both eyes 03/12/2021  ? Oral allergy syndrome, subsequent encounter 03/12/2021  ? Precordial pain 10/03/2020  ? 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 Eczema   ? Vaginal Pap smear, abnormal 2020  ? Flexural eczema 02/01/2017  ? Other allergic rhinitis 04/14/2016  ? ? ?[name], PT, DPT03/04/2309:58 AM ? ? ?[place] ?[place] [facility] [place] ?[place], [place], [address] ?Phone: [phone]   Fax:  [phone] ? ?Name: Rayne Cowdrey ?MRN: [id number] ?Date of Birth: 1996-08-13 ? ? ? ?

## 2021-12-11 ENCOUNTER — Ambulatory Visit: Payer: 59

## 2021-12-11 DIAGNOSIS — R279 Unspecified lack of coordination: Secondary | ICD-10-CM

## 2021-12-11 DIAGNOSIS — M62838 Other muscle spasm: Secondary | ICD-10-CM

## 2021-12-11 NOTE — Therapy (Signed)
[place] ?[place] [facility] [place] ?[place], [place], [address] ?Phone: [phone]   Fax:  [phone] ? ?Physical Therapy Treatment ? ?Patient Details  ?Name: Jessica Potter ?MRN: [id number] ?Date of Birth: May 11, 1996 ?Referring Provider (PT): Truett Mainland, DO ? ? ?Encounter Date: 12/11/2021 ? ? PT End of Session - 12/11/21 1103   ? ? Visit Number 6   ? Date for PT Re-Evaluation 12/31/21   ? Authorization Type UHC   ? PT Start Time 1101   ? PT Stop Time 1140   ? PT Time Calculation (min) 39 min   ? Activity Tolerance Patient tolerated treatment well   ? Behavior During Therapy [facility] for tasks assessed/performed   ? ?  ?  ? ?  ? ? ?Past Medical History:  ?Diagnosis Date  ? Eczema   ? Flexural eczema 02/01/2017  ? Seasonal allergic rhinitis 04/14/2016  ? Vaginal Pap smear, abnormal 2020  ? LGSIL  ? ? ?Past Surgical History:  ?Procedure Laterality Date  ? NO PAST SURGERIES    ? ? ?There were no vitals filed for this visit. ? ? Subjective Assessment - 12/11/21 1103   ? ? Subjective Pt states that she is still making improvements, but pain is still there. She feels like improvements have been small the last ocuple of weeks. She has been using her finger in the shower more than carrot. She has not been engaging in forepaly. She is doing regular exercises and usingthem before she plays basketball.   ? Patient Stated Goals To reduce pain with intercourse.   ? Currently in Pain? No/denies   ? Multiple Pain Sites No   ? ?  ?  ? ?  ? ? ? ? ? ? ? ? ? ? ? ? ? ? ? ? ? ? ? ? [place] Adult PT Treatment/Exercise - 12/11/21 0001   ? ?  ? Self-Care  ? Self-Care Other Self-Care Comments   Foreplay/preparing for intercourse; talking with MD about estrogen cream options; pelvic floor muscle wand  ?  ? Manual Therapy  ? Manual Therapy Internal Pelvic Floor   ? Internal Pelvic Floor Superficial pelvic floor desensitization; urethral mobilization bil; bil deep pelvic floor release   ? ?  ?  ? ?   ? ? ? ? ? ? ? ? ? ? ? ? PT Short Term Goals - 11/19/21 1026   ? ?  ? PT SHORT TERM GOAL #1  ? Title Pt will be independent with HEP.   ? Time 4   ? Period Weeks   ? Status Achieved   ? Target Date 11/05/21   ?  ? PT SHORT TERM GOAL #2  ? Title Pt will be independent and demonstrate appropriate form with diaphragmatic breathing and pelvic floor muscle bulge in order to improve pelvic floor relaxation.   ? Time 4   ? Period Weeks   ? Status Achieved   ? Target Date 11/05/21   ? ?  ?  ? ?  ? ? ? ? PT Long Term Goals - 11/19/21 1026   ? ?  ? PT LONG TERM GOAL #1  ? Title Pt will be independent with advanced HEP.   ? Time 12   ? Period Weeks   ? Status Partially Met   ? Target Date 12/31/21   ?  ? PT LONG TERM GOAL #2  ? Title Pt will be able to tolerate vaginal insertion of tampon without  discomfort.   ? Time 12   ? Period Weeks   ? Status Partially Met   ? Target Date 12/31/21   ?  ? PT LONG TERM GOAL #3  ? Title Pt will be able to achieve orgasm without discomfort.   ? Time 12   ? Period Weeks   ? Status Partially Met   ? Target Date 12/31/21   ?  ? PT LONG TERM GOAL #4  ? Title Pt will be able to have pain free vaginal penetration during intercourse in order to enjoy intimate relationship with partner.   ? Time 12   ? Period Weeks   ? Status Partially Met   ? Target Date 12/31/21   ? ?  ?  ? ?  ? ? ? ? ? ? ? ? Plan - 12/11/21 1106   ? ? Clinical Impression Statement Pt overall doing well. Believe she has not seen more progress due to not having been working with partner at all and not engaging in foreplay activities prior to intercourse. We also discussed talking with MD about other estrogen cream options since she had itching with the current one and has not been using - due to dryness and burning in tissues, believe this will be very helpful. Pt education performed on many different foreplay/arousal options prior to actual penetration that invovle/don't involve partner depending on comfort levels for both of them.  Due to deeper pelvic floor tightness, believe that pelivc floor muscle wand may be a good options and she would be able to use it for some superficial myofascial release as well - she was agreeable. Internal demonstrated burning at 7 and 2 o'clock that was more notable and did not decrease as quickly as other burning. She will continue to benefit from skilled PT intervention in order to progress pelvic floor desensitization.   ? PT Treatment/Interventions ADLs/Self Care Home Management;Biofeedback;Cryotherapy;Electrical Stimulation;Moist Heat;Therapeutic activities;Therapeutic exercise;Neuromuscular re-education;Manual techniques;Patient/family education;Passive range of motion;Dry needling;Spinal Manipulations   ? PT Next Visit Plan Continue pelvic floor desensitization; make this a priority of treatments since patient is unable to purchase dilators at this time; contiue down training and possibly begin gentle strengthening.   ? PT Home Exercise Plan [email or web address]   ? Consulted and Agree with Plan of Care Patient   ? ?  ?  ? ?  ? ? ?Patient will benefit from skilled therapeutic intervention in order to improve the following deficits and impairments:  Decreased coordination, Increased fascial restricitons, Impaired tone, Decreased endurance, Pain, Increased muscle spasms, Decreased mobility, Decreased strength ? ?Visit Diagnosis: ?Unspecified lack of coordination ? ?Other muscle spasm ? ? ? ? ?Problem List ?Patient Active Problem List  ? Diagnosis Date Noted  ? Allergic conjunctivitis of both eyes 03/12/2021  ? Oral allergy syndrome, subsequent encounter 03/12/2021  ? Precordial pain 10/03/2020  ? 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 Eczema   ? Vaginal Pap smear, abnormal 2020  ? Flexural eczema 02/01/2017  ? Other allergic rhinitis 04/14/2016  ? ? ?Jessica Potter, PT, DPT03/30/2311:58 AM ? ? ?[place] ?[place] [facility] [place] ?[place], [place], [address] ?Phone:  [phone]   Fax:  [phone] ? ?Name: Jessica Potter ?MRN: [id number] ?Date of Birth: July 19, 1996 ? ? ? ?

## 2021-12-16 ENCOUNTER — Other Ambulatory Visit (HOSPITAL_BASED_OUTPATIENT_CLINIC_OR_DEPARTMENT_OTHER): Payer: Self-pay

## 2021-12-16 ENCOUNTER — Ambulatory Visit (INDEPENDENT_AMBULATORY_CARE_PROVIDER_SITE_OTHER): Payer: 59

## 2021-12-16 VITALS — Ht 63.0 in | Wt 135.0 lb

## 2021-12-16 DIAGNOSIS — Z3042 Encounter for surveillance of injectable contraceptive: Secondary | ICD-10-CM

## 2021-12-16 NOTE — Progress Notes (Signed)
Date last pap: 03-10-21. ?Last Depo-Provera: 09/30/21. ?Side Effects if any: none. ?Serum HCG indicated? Not indicated. ?Depo-Provera 150 mg IM given by: Burnard Leigh RN . ?Next appointment due after June 27th - she will schedule annual exam and next Depo Provera.  ? ? ?Patient also complaining of some jaw pain that has increase and notice swelling on left side of jaw- advise patient to seek care with a dentist. Patient states understanding. Armandina Stammer RN  ?

## 2021-12-31 ENCOUNTER — Ambulatory Visit: Payer: 59 | Attending: Family Medicine

## 2021-12-31 ENCOUNTER — Other Ambulatory Visit: Payer: Self-pay | Admitting: Family Medicine

## 2021-12-31 DIAGNOSIS — R279 Unspecified lack of coordination: Secondary | ICD-10-CM | POA: Insufficient documentation

## 2021-12-31 DIAGNOSIS — M62838 Other muscle spasm: Secondary | ICD-10-CM | POA: Insufficient documentation

## 2021-12-31 MED ORDER — FEXOFENADINE HCL 180 MG PO TABS: 180.0000 mg | ORAL_TABLET | Freq: Every day | ORAL | 3 refills | Status: AC

## 2021-12-31 NOTE — Addendum Note (Signed)
Addended by: [name] A on: 12/31/2021 01:52 PM ? ? Modules accepted: Orders ? ?

## 2021-12-31 NOTE — Therapy (Signed)
[place] ?[place] [facility] [place] ?[place], [place], [address] ?Phone: [phone]   Fax:  [phone] ? ?Physical Therapy Treatment ? ?Patient Details  ?Name: Jessica Potter ?MRN: [id number] ?Date of Birth: 1996/03/20 ?Referring Provider (PT): Truett Mainland, DO ? ? ?Encounter Date: 12/31/2021 ? ? PT End of Session - 12/31/21 1105   ? ? Visit Number 7   ? Date for PT Re-Evaluation 03/25/22   ? Authorization Type UHC   ? PT Start Time 1103   ? PT Stop Time 2197   ? PT Time Calculation (min) 39 min   ? ?  ?  ? ?  ? ? ?Past Medical History:  ?Diagnosis Date  ? Eczema   ? Flexural eczema 02/01/2017  ? Seasonal allergic rhinitis 04/14/2016  ? Vaginal Pap smear, abnormal 2020  ? LGSIL  ? ? ?Past Surgical History:  ?Procedure Laterality Date  ? NO PAST SURGERIES    ? ? ?There were no vitals filed for this visit. ? ? Subjective Assessment - 12/31/21 1106   ? ? Subjective Pt states that she is about the same since her last visit, but about 60% better overall. She hsa been trying to work more with carrot and her finger on desensitization. She feels like lidocaine lubricant is very helpful. She has not started using estrogen cream yet.   ? Patient Stated Goals To reduce pain with intercourse.   ? Currently in Pain? No/denies   ? Multiple Pain Sites No   ? ?  ?  ? ?  ? ? ? ? ? ? ? ?No emotional/communication barriers or cognitive limitation. Patient is motivated to learn. Patient understands and agrees with treatment goals and plan. PT explains patient will be examined in standing, sitting, and lying down to see how their muscles and joints work. When they are ready, they will be asked to remove their underwear so PT can examine their perineum. The patient is also given the option of providing their own chaperone as one is not provided in our facility. The patient also has the right and is explained the right to defer or refuse any part of the evaluation or treatment including the  internal exam. With the patient's consent, PT will use one gloved finger to gently assess the muscles of the pelvic floor, seeing how well it contracts and relaxes and if there is muscle symmetry. After, the patient will get dressed and PT and patient will discuss exam findings and plan of care. PT and patient discuss plan of care, schedule, attendance policy and HEP activities. ? ? ? ? ? ? ? ? ? ? ? ? ? [place] Adult PT Treatment/Exercise - 12/31/21 0001   ? ?  ? Self-Care  ? Self-Care Other Self-Care Comments   use of estrogen cream; lidocaine lubricants once she is out of sample; addressingtightness in inner thighs.  ?  ? Manual Therapy  ? Manual Therapy Soft tissue mobilization;Internal Pelvic Floor   ? Soft tissue mobilization Bil adductors with massage tool   ? Internal Pelvic Floor Superficial pelvic floor desensitization; urethral mobilization bil; bil deep pelvic floor release   ? ?  ?  ? ?  ? ? ? ? ? ? ? ? ? ? PT Education - 12/31/21 1147   ? ? Education Details Pt education performed on importance and benefit of using estrogen cream that was prescribed; discussed trying to get deeper pelvic floor layers with carrot. Pt education perofrmed on continuing  POC.   ? Person(s) Educated Patient   ? Methods Explanation;Demonstration;Tactile cues;Verbal cues;Handout   ? Comprehension Verbalized understanding   ? ?  ?  ? ?  ? ? ? PT Short Term Goals - 12/31/21 1148   ? ?  ? PT SHORT TERM GOAL #1  ? Title Pt will be independent with HEP.   ? Time 4   ? Period Weeks   ? Status Achieved   ? Target Date 11/05/21   ?  ? PT SHORT TERM GOAL #2  ? Title Pt will be independent and demonstrate appropriate form with diaphragmatic breathing and pelvic floor muscle bulge in order to improve pelvic floor relaxation.   ? Time 4   ? Period Weeks   ? Status Achieved   ? Target Date 11/05/21   ? ?  ?  ? ?  ? ? ? ? PT Long Term Goals - 12/31/21 1148   ? ?  ? PT LONG TERM GOAL #1  ? Title Pt will be independent with advanced HEP.   ?  Time 12   ? Period Weeks   ? Status Partially Met   ? Target Date 12/31/21   ?  ? PT LONG TERM GOAL #2  ? Title Pt will be able to tolerate vaginal insertion of tampon without discomfort.   ? Time 12   ? Period Weeks   ? Status Partially Met   ? Target Date 12/31/21   ?  ? PT LONG TERM GOAL #3  ? Title Pt will be able to achieve orgasm without discomfort.   ? Time 12   ? Period Weeks   ? Status Partially Met   ? Target Date 12/31/21   ?  ? PT LONG TERM GOAL #4  ? Title Pt will be able to have pain free vaginal penetration during intercourse in order to enjoy intimate relationship with partner.   ? Time 12   ? Period Weeks   ? Status Partially Met   ? Target Date 12/31/21   ? ?  ?  ? ?  ? ? ? ? ? ? ? ? Plan - 12/31/21 1110   ? ? Clinical Impression Statement Pt has overall made good progress with skilled PT intervention demosntrated by 60% improvement reported since starting. However, she is seeing slower progress since the last visit. She was encouraged totry and perform deeper release with use of carrot and begin use of estrogen cream as it will most likely be very helpful. She demonstrates notable improvements in superficial pelvic floor tension and discomfort with mild sorneess in 7 o'clock and burning at 1 o'clock instead of clenching/pain throughout. She is demosntrating increased deep restriction the last several visits, but toelrated release techniques well. She also had adductors restriction today and instrument assisted soft tissue mobilization performed to help improve with good tolerance. She will continue to benefit from skilled PT intervention in order to progress pelvic floor desensitization.   ? PT Frequency Other (comment)   1x/month  ? PT Duration 12 weeks   ? PT Treatment/Interventions ADLs/Self Care Home Management;Biofeedback;Cryotherapy;Electrical Stimulation;Moist Heat;Therapeutic activities;Therapeutic exercise;Neuromuscular re-education;Manual techniques;Patient/family education;Passive range  of motion;Dry needling;Spinal Manipulations   ? PT Next Visit Plan Continue pelvic floor desensitization; make this a priority of treatments since patient is unable to purchase dilators at this time; contiue down training and possibly begin gentle strengthening.   ? PT Home Exercise Plan [email or web address]   ? Consulted and Agree with Plan of Care Patient   ? ?  ?  ? ?  ? ? ?  Patient will benefit from skilled therapeutic intervention in order to improve the following deficits and impairments:  Decreased coordination, Increased fascial restricitons, Impaired tone, Decreased endurance, Pain, Increased muscle spasms, Decreased mobility, Decreased strength ? ?Visit Diagnosis: ?Unspecified lack of coordination ? ?Other muscle spasm ? ? ? ? ?Problem List ?Patient Active Problem List  ? Diagnosis Date Noted  ? Allergic conjunctivitis of both eyes 03/12/2021  ? Oral allergy syndrome, subsequent encounter 03/12/2021  ? Precordial pain 10/03/2020  ? 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 Eczema   ? Vaginal Pap smear, abnormal 2020  ? Flexural eczema 02/01/2017  ? Other allergic rhinitis 04/14/2016  ? ? ?[name], PT, DPT04/19/231:50 PM ? ? ?[place] ?[place] [facility] [place] ?[place], [place], [address] ?Phone: [phone]   Fax:  [phone] ? ?Name: Jessica Potter ?MRN: [id number] ?Date of Birth: Oct 23, 1995 ? ? ? ?

## 2022-01-15 HISTORY — PX: WISDOM TOOTH EXTRACTION: SHX21

## 2022-01-27 ENCOUNTER — Ambulatory Visit: Payer: 59 | Attending: Family Medicine

## 2022-01-27 DIAGNOSIS — R279 Unspecified lack of coordination: Secondary | ICD-10-CM | POA: Insufficient documentation

## 2022-01-27 DIAGNOSIS — M62838 Other muscle spasm: Secondary | ICD-10-CM | POA: Diagnosis present

## 2022-01-27 NOTE — Therapy (Signed)
[place] [facility] ?[address] ?[place], [place], [address] ?Phone: [phone]   Fax:  [phone] ? ?Physical Therapy Treatment ? ?Patient Details  ?Name: Jessica Potter ?MRN: [id number] ?Date of Birth: 05/25/1996 ?Referring Provider (PT): Stinson, Jacob J, DO ? ? ?Encounter Date: 01/27/2022 ? ? PT End of Session - 01/27/22 1016   ? ? Visit Number 8   ? Date for PT Re-Evaluation 03/25/22   ? Authorization Type UHC   ? PT Start Time 1015   ? PT Stop Time 1055   ? PT Time Calculation (min) 40 min   ? Activity Tolerance Patient tolerated treatment well   ? Behavior During Therapy WFL for tasks assessed/performed   ? ?  ?  ? ?  ? ? ?Past Medical History:  ?Diagnosis Date  ? Eczema   ? Flexural eczema 02/01/2017  ? Seasonal allergic rhinitis 04/14/2016  ? Vaginal Pap smear, abnormal 2020  ? LGSIL  ? ? ?Past Surgical History:  ?Procedure Laterality Date  ? NO PAST SURGERIES    ? ? ?There were no vitals filed for this visit. ? ? Subjective Assessment - 01/27/22 1017   ? ? Subjective Pt states that everything continues to get better. She does still have some amount of pain during intercourse, but states that it is a small amount, still at the beginning.   ? Patient Stated Goals To reduce pain with intercourse.   ? Currently in Pain? No/denies   ? Multiple Pain Sites No   ? ?  ?  ? ?  ? ? ? ? ? ? ? ? ? ? ? ? ? ? ? ? ? ? ? ? [facility] Adult PT Treatment/Exercise - 01/27/22 0001   ? ?  ? Lumbar Exercises: Stretches  ? Active Hamstring Stretch Right;Left;3 reps;60 seconds   3 directions bil  ? Piriformis Stretch Right;Left;1 rep;60 seconds   ?  ? Lumbar Exercises: Supine  ? Bridge with Ball Squeeze 20 reps;10 reps   ? Straight Leg Raise 20 reps   ?  ? Lumbar Exercises: Sidelying  ? Clam Both;20 reps   ?  ? Lumbar Exercises: Quadruped  ? Madcat/Old Horse 20 reps   ? Opposite Arm/Leg Raise 20 reps   ? Other Quadruped Lumbar Exercises Child's pose 10 breaths; child's pose with lateral  pull 10 breaths bil; puppy pose 10 breaths   ? ?  ?  ? ?  ? ? ? ? ? ? ? ? ? ? PT Education - 01/27/22 1021   ? ? Education Details Finishing POC.   ? Person(s) Educated Patient   ? Methods Explanation;Demonstration;Tactile cues;Verbal cues;Handout   ? Comprehension Verbalized understanding   ? ?  ?  ? ?  ? ? ? PT Short Term Goals - 12/31/21 1148   ? ?  ? PT SHORT TERM GOAL #1  ? Title Pt will be independent with HEP.   ? Time 4   ? Period Weeks   ? Status Achieved   ? Target Date 11/05/21   ?  ? PT SHORT TERM GOAL #2  ? Title Pt will be independent and demonstrate appropriate form with diaphragmatic breathing and pelvic floor muscle bulge in order to improve pelvic floor relaxation.   ? Time 4   ? Period Weeks   ? Status Achieved   ? Target Date 11/05/21   ? ?  ?  ? ?  ? ? ? ? PT Long Term Goals -   12/31/21 1148   ? ?  ? PT LONG TERM GOAL #1  ? Title Pt will be independent with advanced HEP.   ? Time 12   ? Period Weeks   ? Status Partially Met   ? Target Date 12/31/21   ?  ? PT LONG TERM GOAL #2  ? Title Pt will be able to tolerate vaginal insertion of tampon without discomfort.   ? Time 12   ? Period Weeks   ? Status Partially Met   ? Target Date 12/31/21   ?  ? PT LONG TERM GOAL #3  ? Title Pt will be able to achieve orgasm without discomfort.   ? Time 12   ? Period Weeks   ? Status Partially Met   ? Target Date 12/31/21   ?  ? PT LONG TERM GOAL #4  ? Title Pt will be able to have pain free vaginal penetration during intercourse in order to enjoy intimate relationship with partner.   ? Time 12   ? Period Weeks   ? Status Partially Met   ? Target Date 12/31/21   ? ?  ?  ? ?  ? ? ? ? ? ? ? ? Plan - 01/27/22 1017   ? ? Clinical Impression Statement Patient making great progress with dyspareunia overall. She states that she is having a little more diffiuclty getting to stretches at home and would like to focus on them today. She tolerated alldown training and mobility stretches very well and we discussed beginning to  incorporate more strengthening to help make sure pelvic floor is getting good A/ROM and circulation. She will continue to benefit from skilled PT intervention in order to progress pelvic floor desensitization.   ? PT Treatment/Interventions ADLs/Self Care Home Management;Biofeedback;Cryotherapy;Electrical Stimulation;Moist Heat;Therapeutic activities;Therapeutic exercise;Neuromuscular re-education;Manual techniques;Patient/family education;Passive range of motion;Dry needling;Spinal Manipulations   ? PT Next Visit Plan --   ? PT Home Exercise Plan [email or web address]   ? Consulted and Agree with Plan of Care Patient   ? ?  ?  ? ?  ? ? ?Patient will benefit from skilled therapeutic intervention in order to improve the following deficits and impairments:  Decreased coordination, Increased fascial restricitons, Impaired tone, Decreased endurance, Pain, Increased muscle spasms, Decreased mobility, Decreased strength ? ?Visit Diagnosis: ?Unspecified lack of coordination ? ?Other muscle spasm ? ? ? ? ?Problem List ?Patient Active Problem List  ? Diagnosis Date Noted  ? Allergic conjunctivitis of both eyes 03/12/2021  ? Oral allergy syndrome, subsequent encounter 03/12/2021  ? Precordial pain 10/03/2020  ? 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 Eczema   ? Vaginal Pap smear, abnormal 2020  ? Flexural eczema 02/01/2017  ? Other allergic rhinitis 04/14/2016  ? ? ?[name], PT, DPT05/16/2311:01 AM ? ? ?[facility] ?[facility] ?[place], [place], [address] ?Phone: [phone]   Fax:  [phone] ? ?Name: Shakti Fleer ?MRN: [id number] ?Date of Birth: August 26, 1996 ? ? ? ?

## 2022-02-24 ENCOUNTER — Ambulatory Visit: Payer: 59 | Attending: Family Medicine

## 2022-02-24 DIAGNOSIS — R279 Unspecified lack of coordination: Secondary | ICD-10-CM | POA: Insufficient documentation

## 2022-02-24 DIAGNOSIS — M62838 Other muscle spasm: Secondary | ICD-10-CM | POA: Diagnosis present

## 2022-02-24 NOTE — Therapy (Signed)
[facility] @ [place], [place], [address] Phone: [phone]   Fax:  [phone]  Physical Therapy Treatment  Patient Details  Name: Jessica Potter MRN: [id number] Date of Birth: 03-Aug-1996 Referring Provider (PT): Truett Mainland, DO   Encounter Date: 02/24/2022   PT End of Session - 02/24/22 1017     Visit Number 9    Date for PT Re-Evaluation 03/25/22    Authorization Type UHC    PT Start Time 3329    PT Stop Time 1048    PT Time Calculation (min) 33 min    Activity Tolerance Patient tolerated treatment well    Behavior During Therapy [facility] for tasks assessed/performed             Past Medical History:  Diagnosis Date   Eczema    Flexural eczema 02/01/2017   Seasonal allergic rhinitis 04/14/2016   Vaginal Pap smear, abnormal 2020   LGSIL    Past Surgical History:  Procedure Laterality Date   NO PAST SURGERIES      There were no vitals filed for this visit.   Subjective Assessment - 02/24/22 1017     Subjective Pt states that she has not been sexually active with partner this month due to timing difficulties. She has conitnued performing desensitization on her own, but feels like she needs help working through pain in some of the superficial spots today.    Patient Stated Goals To reduce pain with intercourse.    Currently in Pain? No/denies    Multiple Pain Sites No                   No emotional/communication barriers or cognitive limitation. Patient is motivated to learn. Patient understands and agrees with treatment goals and plan. PT explains patient will be examined in standing, sitting, and lying down to see how their muscles and joints work. When they are ready, they will be asked to remove their underwear so PT can examine their perineum. The patient is also given the option of providing their own chaperone as one is not provided in our facility. The patient also has the right  and is explained the right to defer or refuse any part of the evaluation or treatment including the internal exam. With the patient's consent, PT will use one gloved finger to gently assess the muscles of the pelvic floor, seeing how well it contracts and relaxes and if there is muscle symmetry. After, the patient will get dressed and PT and patient will discuss exam findings and plan of care. PT and patient discuss plan of care, schedule, attendance policy and HEP activities.             [facility] Adult PT Treatment/Exercise - 02/24/22 0001       Self-Care   Self-Care Other Self-Care Comments   Reviewed desensitization work with use of finger and carrot     Manual Therapy   Manual Therapy Soft tissue mobilization;Internal Pelvic Floor    Manual therapy comments verbal consent provided by patient for internal pelvic floor exam.    Soft tissue mobilization Lower abdomen    Internal Pelvic Floor Superficial pelvic floor desensitization; urethral mobilization bil; bil deep pelvic floor release                     PT Education - 02/24/22 1052     Education Details Patient educated on pelvic floor desensitization modifications with use of  finger/carrot.    Person(s) Educated Patient    Methods Explanation;Demonstration;Tactile cues;Verbal cues    Comprehension Verbalized understanding              PT Short Term Goals - 02/24/22 1022       PT SHORT TERM GOAL #1   Title Pt will be independent with HEP.    Time 4    Period Weeks    Status Achieved    Target Date 11/05/21      PT SHORT TERM GOAL #2   Title Pt will be independent and demonstrate appropriate form with diaphragmatic breathing and pelvic floor muscle bulge in order to improve pelvic floor relaxation.    Time 4    Period Weeks    Status Achieved    Target Date 11/05/21               PT Long Term Goals - 02/24/22 1023       PT LONG TERM GOAL #1   Title Pt will be independent with advanced  HEP.    Baseline Continuing to do well with additions and modifications.    Time 12    Period Weeks    Status Partially Met    Target Date 12/31/21      PT LONG TERM GOAL #2   Title Pt will be able to tolerate vaginal insertion of tampon without discomfort.    Baseline Patient is not currently having period - has not had vaginal insertion with partner recently.    Time 12    Period Weeks    Status Partially Met    Target Date 12/31/21      PT LONG TERM GOAL #3   Title Pt will be able to achieve orgasm without discomfort.    Baseline Patient reports no pain and improved ease of orgasm.    Time 12    Period Weeks    Status Achieved    Target Date 12/31/21      PT LONG TERM GOAL #4   Title Pt will be able to have pain free vaginal penetration during intercourse in order to enjoy intimate relationship with partner.    Baseline Not attempted in last month.    Time 12    Period Weeks    Status Partially Met    Target Date 12/31/21                   Plan - 02/24/22 1021     Clinical Impression Statement Patient continuing to make good progress with self-reported improvements doing her own desnsitization. However, due to to report of having diffiuclty releasing some spots on her own, we returned to internal pelvic floor release/desensitization. She did have increase in superficial pelvic floor tension, most notably at perineal body. We discussed that this is a sight of muscle attachment and very important to get with her finger. We also discussed using a shorter carrot in order to get to some of the deep tension on the Lt side as it will give her more control and ability to apply more pressure. She denies any questions with HEP otherwise. She was encouraged to attempt intercourse over the next month several times if she can in order to assess progress before D/C. She will continue to benefit from skilled PT intervention in order to progress pelvic floor desensitization.    PT  Treatment/Interventions ADLs/Self Care Home Management;Biofeedback;Cryotherapy;Electrical Stimulation;Moist Heat;Therapeutic activities;Therapeutic exercise;Neuromuscular re-education;Manual techniques;Patient/family education;Passive range of motion;Dry needling;Spinal Manipulations  PT Next Visit Plan Continue pelvic floor desensitization as needed; progress mobility/strengthening. Plan to D/C next session if there are no changes.    PT Home Exercise Plan [email or web address]    Consulted and Agree with Plan of Care Patient             Patient will benefit from skilled therapeutic intervention in order to improve the following deficits and impairments:  Decreased coordination, Increased fascial restricitons, Impaired tone, Decreased endurance, Pain, Increased muscle spasms, Decreased mobility, Decreased strength  Visit Diagnosis: Unspecified lack of coordination  Other muscle spasm     Problem List Patient Active Problem List   Diagnosis Date Noted   Allergic conjunctivitis of both eyes 03/12/2021   Oral allergy syndrome, subsequent encounter 03/12/2021   Precordial pain 10/03/2020   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

## 2022-03-10 ENCOUNTER — Ambulatory Visit: Payer: 59

## 2022-03-11 ENCOUNTER — Ambulatory Visit (INDEPENDENT_AMBULATORY_CARE_PROVIDER_SITE_OTHER): Payer: 59 | Admitting: Obstetrics & Gynecology

## 2022-03-11 ENCOUNTER — Encounter: Payer: Self-pay | Admitting: Obstetrics & Gynecology

## 2022-03-11 ENCOUNTER — Other Ambulatory Visit (HOSPITAL_BASED_OUTPATIENT_CLINIC_OR_DEPARTMENT_OTHER): Payer: Self-pay

## 2022-03-11 ENCOUNTER — Other Ambulatory Visit (HOSPITAL_COMMUNITY)
Admission: RE | Admit: 2022-03-11 | Discharge: 2022-03-11 | Disposition: A | Discharge status: Home / Self Care | Payer: 59 | Source: Ambulatory Visit | Attending: Obstetrics & Gynecology | Admitting: Obstetrics & Gynecology

## 2022-03-11 VITALS — BP 109/71 | HR 99 | Ht 63.0 in | Wt 139.0 lb

## 2022-03-11 DIAGNOSIS — Z3042 Encounter for surveillance of injectable contraceptive: Secondary | ICD-10-CM

## 2022-03-11 DIAGNOSIS — R87612 Low grade squamous intraepithelial lesion on cytologic smear of cervix (LGSIL): Secondary | ICD-10-CM

## 2022-03-11 DIAGNOSIS — Z01419 Encounter for gynecological examination (general) (routine) without abnormal findings: Secondary | ICD-10-CM | POA: Insufficient documentation

## 2022-03-11 DIAGNOSIS — Z3009 Encounter for other general counseling and advice on contraception: Secondary | ICD-10-CM | POA: Diagnosis not present

## 2022-03-11 MED ORDER — MEDROXYPROGESTERONE ACETATE 150 MG/ML IM SUSP
150.0000 mg | Freq: Once | INTRAMUSCULAR | Status: AC
  Administered 2022-03-11: 150 mg via INTRAMUSCULAR

## 2022-03-11 NOTE — Progress Notes (Signed)
Subjective:     Nahia Nissan is a 26 y.o. female here for a routine exam.  Current complaints: none. Pt has a h/o LGSIL on PAP and mild dysplasia on surg path from bx 2022. Needs f/u PAP. Pt plays basketball and uses treadmill for exercise.       Gynecologic History No LMP recorded. Patient has had an injection. Contraception: Depo-Provera injections Last Pap: 03/10/2021. Results were: abnormal LGSIL Last mammogram: n/a.   Obstetric History OB History  Gravida Para Term Preterm AB Living  0 0 0 0 0 0  SAB IAB Ectopic Multiple Live Births  0 0 0 0 0     The following portions of the patient's history were reviewed and updated as appropriate: allergies, current medications, past family history, past medical history, past social history, past surgical history, and problem list.  Review of Systems Pertinent items are noted in HPI.    Objective:  BP 109/71   Pulse 99   Ht 5\' 3"  (1.6 m)   Wt 139 lb (63 kg)   BMI 24.62 kg/m   General Appearance:    Alert, cooperative, no distress, appears stated age  Head:    Normocephalic, without obvious abnormality, atraumatic  Eyes:    conjunctiva/corneas clear, EOM's intact, both eyes  Ears:    Normal external ear canals, both ears  Nose:   Nares normal, septum midline, mucosa normal, no drainage    or sinus tenderness  Throat:   Lips, mucosa, and tongue normal; teeth and gums normal  Neck:   Supple, symmetrical, trachea midline, no adenopathy;    thyroid:  no enlargement/tenderness/nodules  Back:     Symmetric, no curvature, ROM normal, no CVA tenderness  Lungs:     respirations unlabored  Chest Wall:    No tenderness or deformity   Heart:    Regular rate and rhythm  Breast Exam:    No tenderness, masses, or nipple abnormality  Abdomen:     Soft, non-tender, bowel sounds active all four quadrants,    no masses, no organomegaly  Genitalia:    Normal female without lesion, discharge or tenderness     Extremities:   Extremities normal,  atraumatic, no cyanosis or edema  Pulses:   2+ and symmetric all extremities  Skin:   Skin color, texture, turgor normal, no rashes or lesions     Assessment:    Healthy female exam.  Prolonged depo use. Reviewed bone health   Plan:  Shakiyla was seen today for gynecologic exam.  Diagnoses and all orders for this visit:  Well female exam with routine gynecological exam  Encounter for counseling regarding contraception  LGSIL on Pap smear of cervix   Add Vit D and Ca++  F/u in 1 year or sooner prn   Chianne Byrns L. Harraway-Smith, M.D., Kaleen Odea

## 2022-03-11 NOTE — Progress Notes (Signed)
Annual exam and depo injection.

## 2022-03-13 LAB — CYTOLOGY - PAP

## 2022-03-14 IMAGING — DX DG CHEST 2V
2 series · 2 of 2 positions shown · non-contrast
Comparison: None.

CLINICAL DATA: Chest pain

EXAM:
CHEST - 2 VIEW

[chest pa]
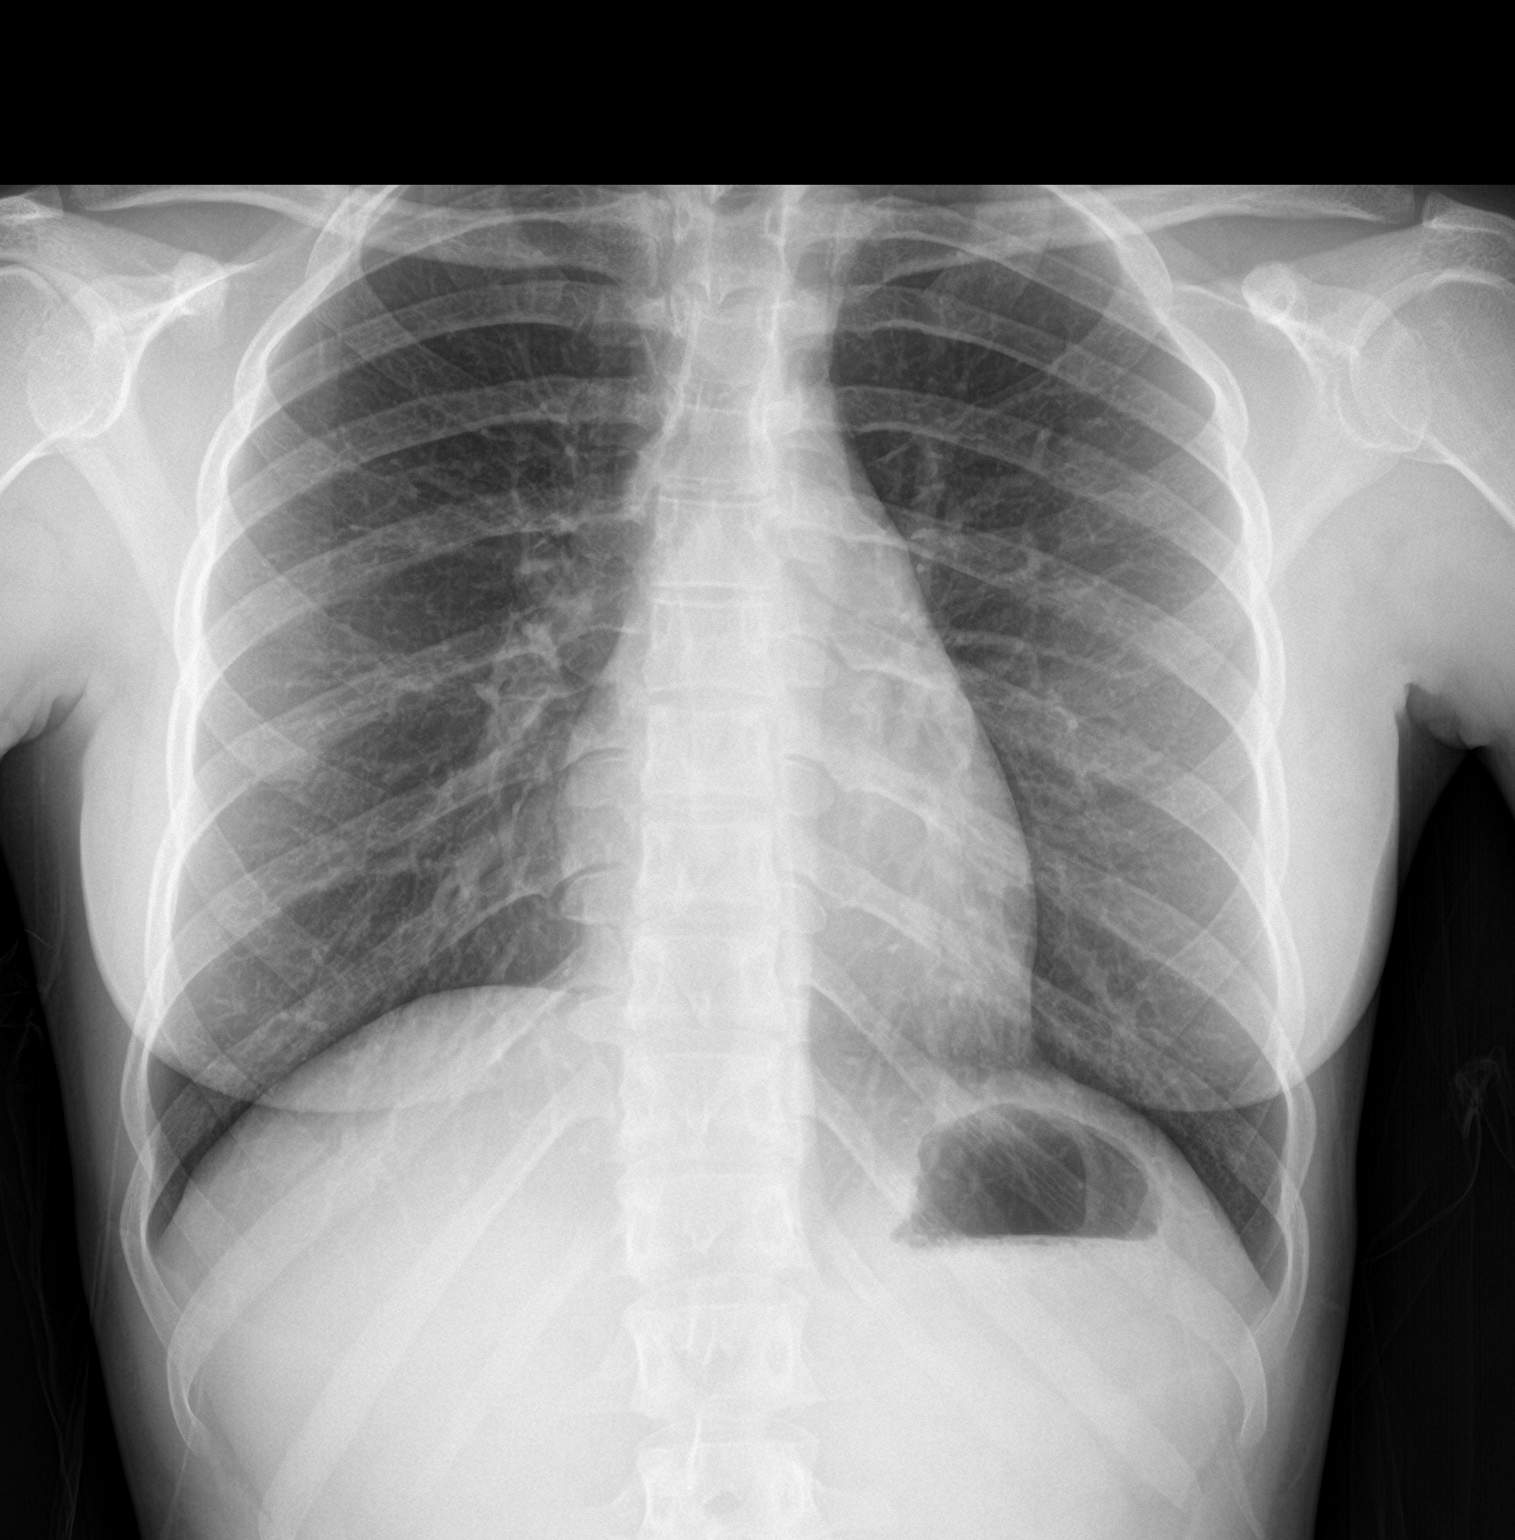

[chest lat]
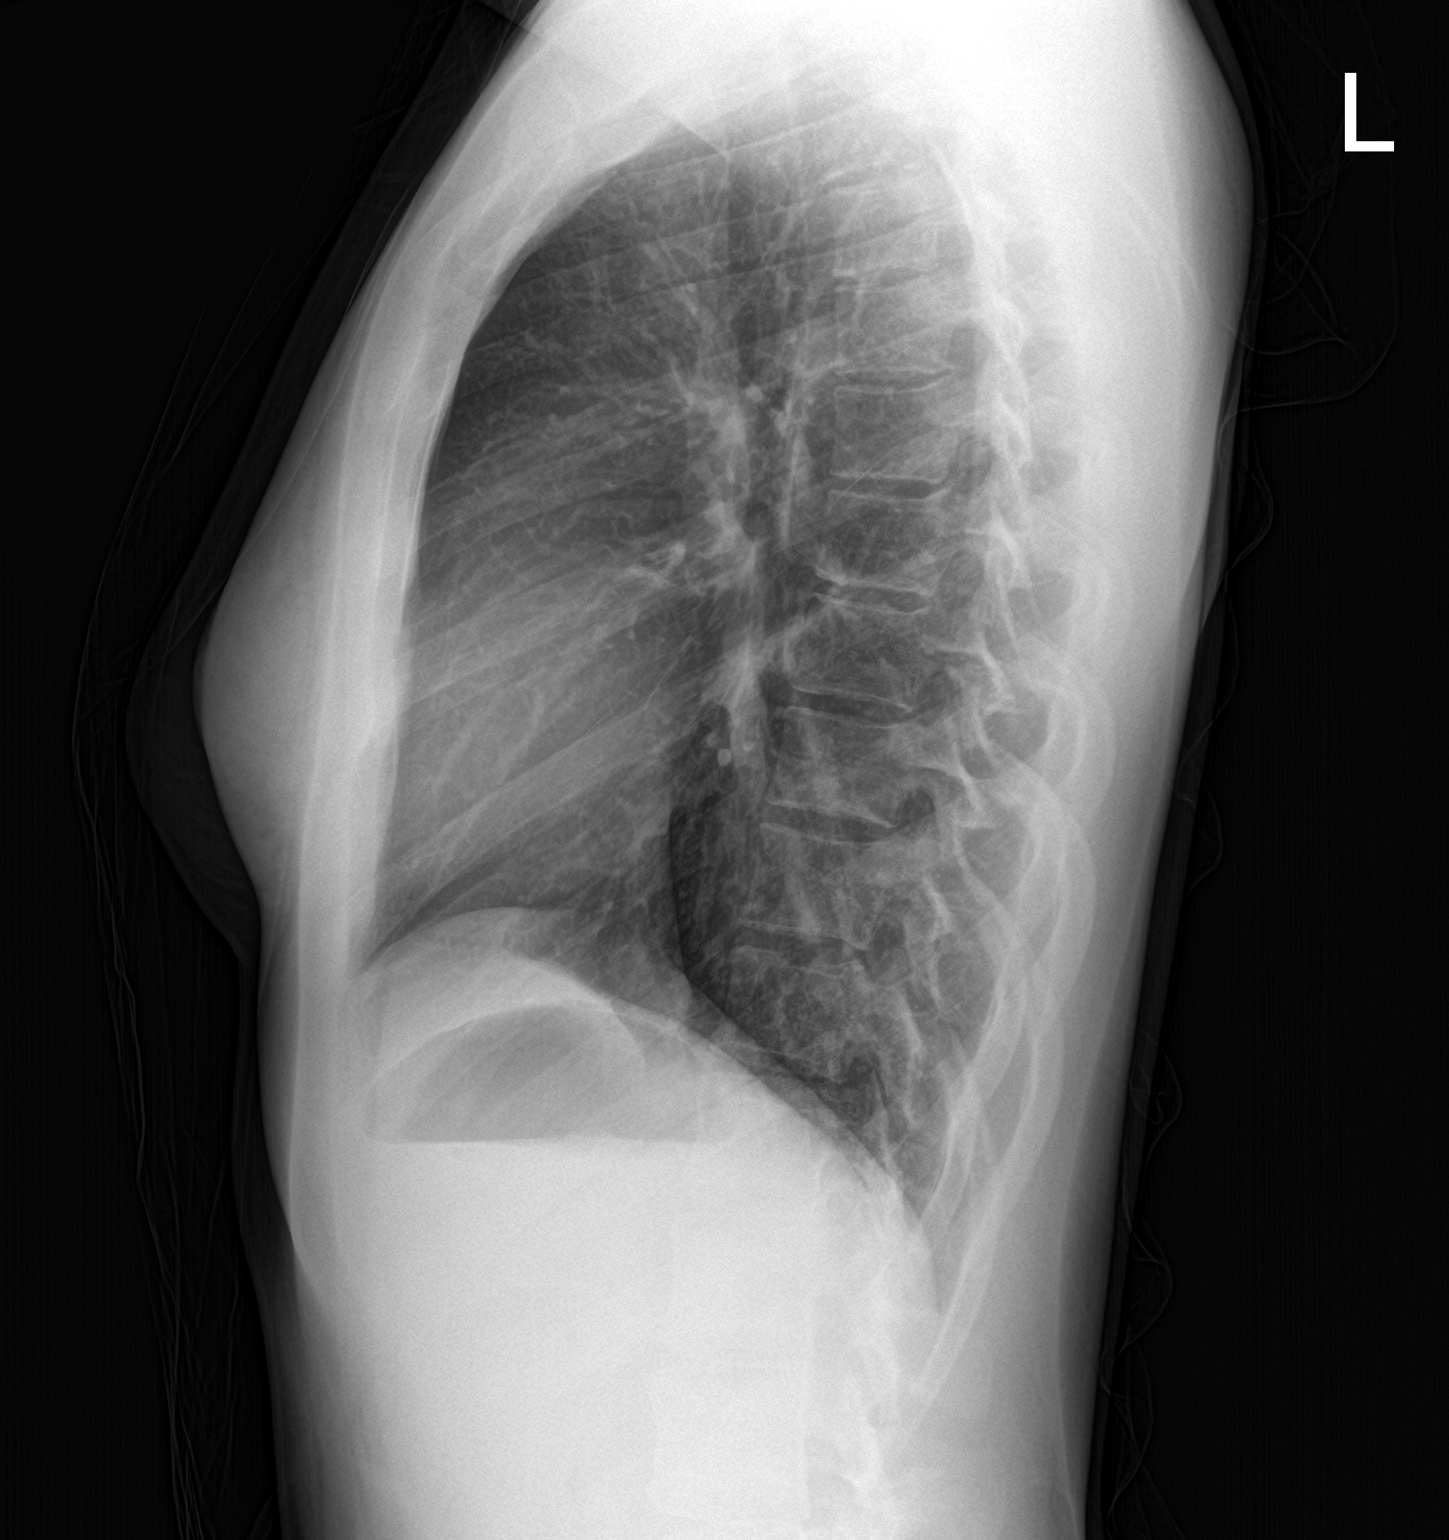

[2 of 2 positions shown; findings below may reference images not displayed]

FINDINGS: The heart size and mediastinal contours are within normal limits.
Both lungs are clear. No visible pleural effusions or pneumothorax.
The visualized skeletal structures are unremarkable.
IMPRESSION: No acute cardiopulmonary disease.

## 2022-03-31 ENCOUNTER — Ambulatory Visit: Payer: Self-pay

## 2022-04-14 ENCOUNTER — Ambulatory Visit: Payer: Self-pay

## 2022-05-16 IMAGING — CT CT HEART MORP W/ CTA COR W/ SCORE W/ CA W/CM &/OR W/O CM
4 of 7 series · 8 of 20 positions shown, 9 images · IV contrast (APPLIED)
Comparison: 07/18/2020 chest radiograph.
COMPARISON: 07/18/2020 chest radiograph.

Addendum:
EXAM:
OVER-READ INTERPRETATION  CT CHEST

The following report is an over-read performed by radiologist Dr.
Mia Zaleski [REDACTED] on 09/19/2020. This over-read
does not include interpretation of cardiac or coronary anatomy or
pathology. The coronary CTA interpretation by the cardiologist is
attached.
CLINICAL DATA: 24 year old female with chest pain.
Cardiac/Coronary  CT
TECHNIQUE: The patient was scanned on a [employer] Force scanner.

[Series 6: best diast 75 % · axial · 0.28mm/px · z∈[-124,-82]mm · 2 of 313 slices shown]
[im 105/313  vessel]
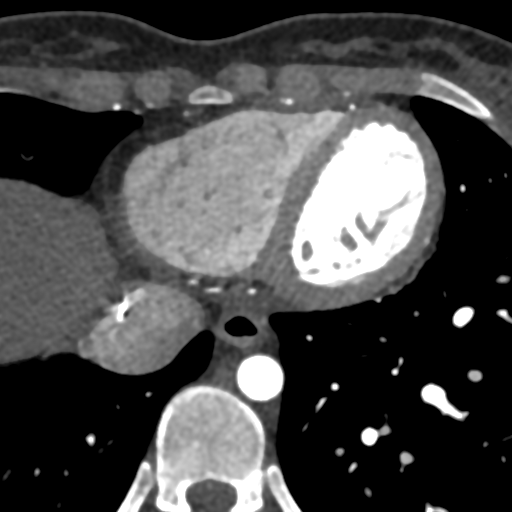
[im 209/313  vessel]
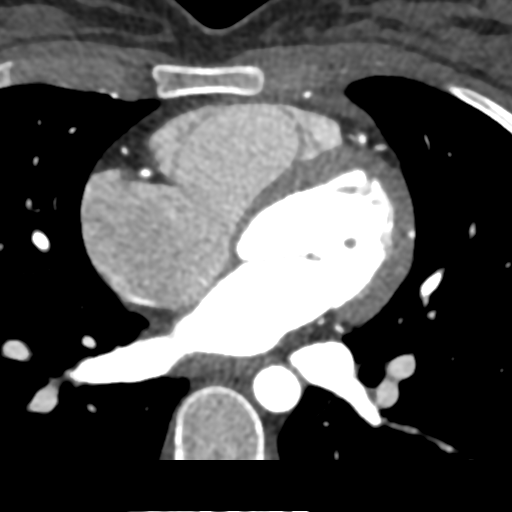

[Series 7: best syst · axial · 0.28mm/px · z∈[-124,-82]mm · 2 of 313 slices shown, 3 images]
[im 105/313  vessel]
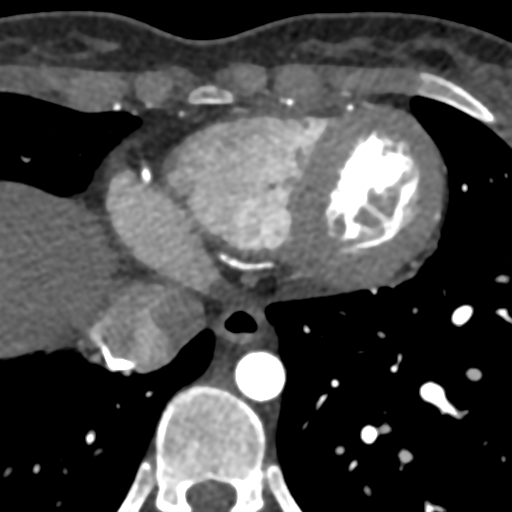
[im 105/313  lung]
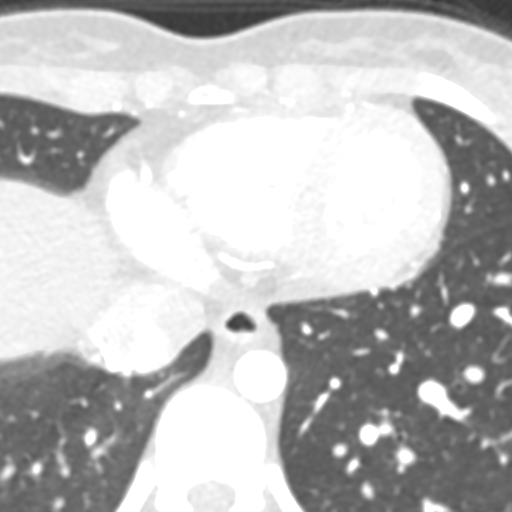
[im 209/313  vessel]
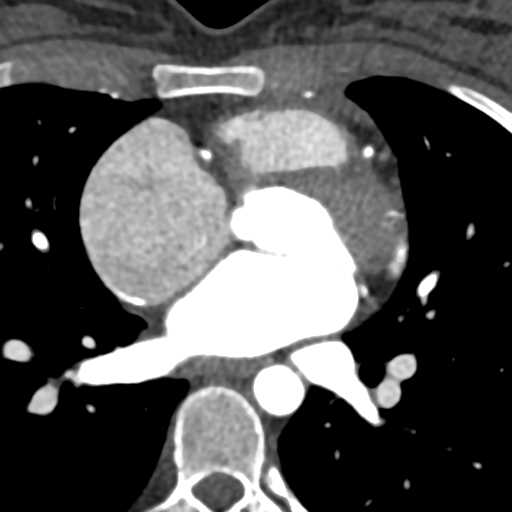

[Series 9: ts diast sharp 75 % · axial · 0.28mm/px · z∈[-124,-82]mm · 2 of 313 slices shown]
[im 105/313  lung]
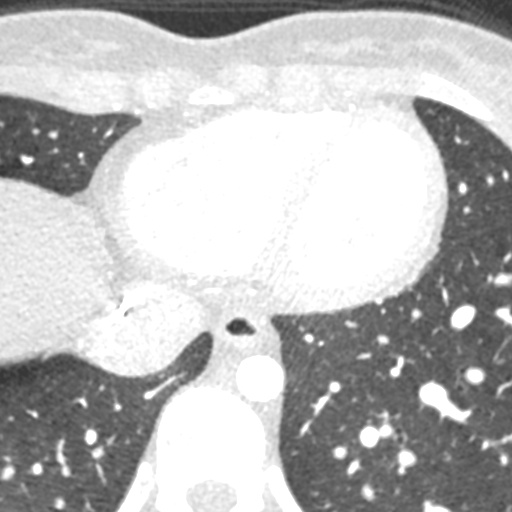
[im 209/313  lung]
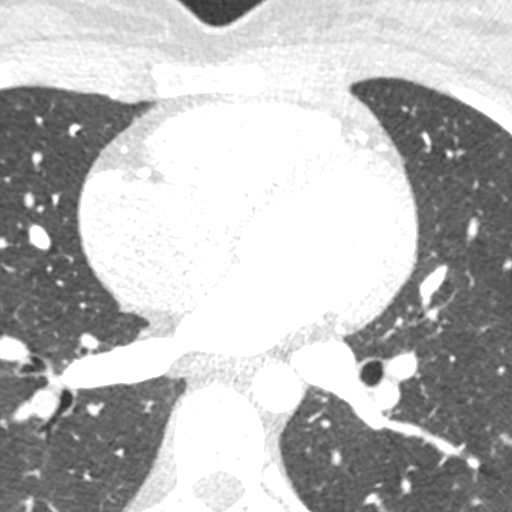

[Series 10: ts syst sharp · axial · 0.28mm/px · z∈[-124,-82]mm · 2 of 313 slices shown]
[im 105/313  lung]
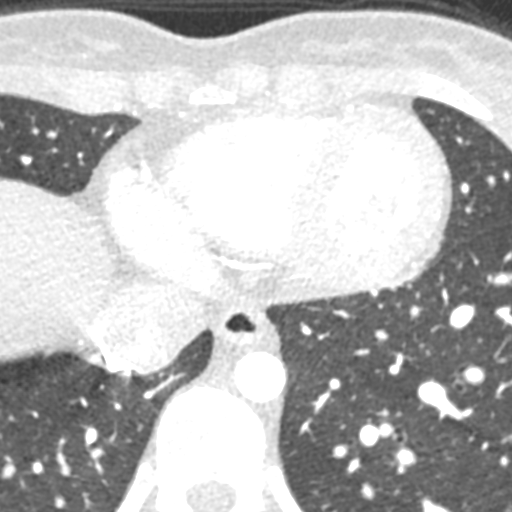
[im 209/313  lung]
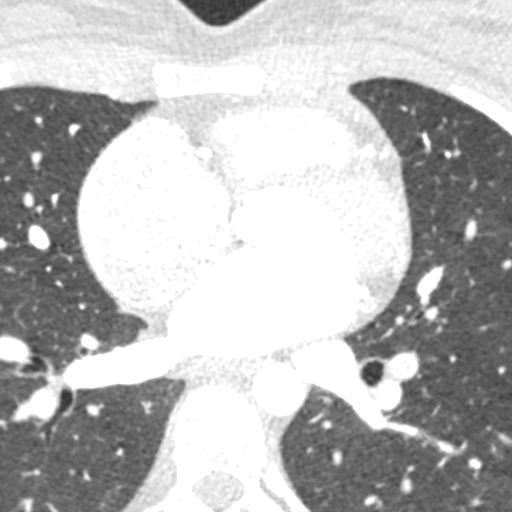

[8 of 20 positions shown; findings below may reference images not displayed]

FINDINGS: Vascular: Normal aortic caliber. No central pulmonary embolism, on
this non-dedicated study.

Mediastinum/Nodes: No imaged mediastinal or hilar adenopathy.

Anterior mediastinal soft tissue density, including on [DATE] is
favored to represent incompletely imaged residual thymic tissue,
within normal variation for age.

Lungs/Pleura: No pleural fluid. Right-sided pulmonary nodules on the
order of 5 mm and less are subpleural in distribution and presumed
subpleural lymph nodes, especially given patient age.

Upper Abdomen: Normal imaged portions of the liver, spleen, stomach.

Musculoskeletal: No acute osseous abnormality.
IMPRESSION: No acute findings in the imaged extracardiac chest.
FINDINGS: A 120 kV prospective scan was triggered in the descending thoracic
aorta at 111 HU's. Axial non-contrast 3 mm slices were carried out
through the heart. The data set was analyzed on a dedicated work
station and scored using the Agatson method. Gantry rotation speed
was 250 msecs and collimation was .6 mm. No beta blockade and 0.8 mg
of sl NTG was given. The 3D data set was reconstructed in 5%
intervals of the 67-82 % of the R-R cycle. Diastolic phases were
analyzed on a dedicated work station using MPR, MIP and VRT modes.
The patient received 80 cc of contrast.

Aorta: Normal size.  No calcifications.  No dissection.

Aortic Valve:  Trileaflet.  No calcifications.

Coronary Arteries:  Normal coronary origin.  Right dominance.

RCA is a large dominant artery that gives rise to PDA and PLVB.
There is no plaque.

Left main is a large artery. Anomalous origin of the left main which
comes off slightly anterior to the left cusp and runs a very short
course between the aorta and the pulmonary artery before it gives
rise to LAD and LCX arteries.

LAD is a large vessel that has no plaque.

LCX is a non-dominant artery that gives rise to one large OM1
branch. There is no plaque.

Other findings:

Normal pulmonary vein drainage into the left atrium.

Normal let atrial appendage without a thrombus.

Normal size of the pulmonary artery.
IMPRESSION: 1. Coronary calcium score of 0. This was 0 percentile for age and
sex matched control.

2. Normal coronary origin with right dominance.

3. Anomalous origin of the left main which comes off slightly
anterior to the left cusp and runs a very short course between the
aorta and the pulmonary artery before it gives rise to LAD and LCX
arteries.

4. No evidence of CAD.

Laenio Orben, DO

*** End of Addendum ***
EXAM:
OVER-READ INTERPRETATION  CT CHEST

The following report is an over-read performed by radiologist Dr.
Mia Zaleski [REDACTED] on 09/19/2020. This over-read
does not include interpretation of cardiac or coronary anatomy or
pathology. The coronary CTA interpretation by the cardiologist is
attached.
FINDINGS: Vascular: Normal aortic caliber. No central pulmonary embolism, on
this non-dedicated study.

Mediastinum/Nodes: No imaged mediastinal or hilar adenopathy.

Anterior mediastinal soft tissue density, including on [DATE] is
favored to represent incompletely imaged residual thymic tissue,
within normal variation for age.

Lungs/Pleura: No pleural fluid. Right-sided pulmonary nodules on the
order of 5 mm and less are subpleural in distribution and presumed
subpleural lymph nodes, especially given patient age.

Upper Abdomen: Normal imaged portions of the liver, spleen, stomach.

Musculoskeletal: No acute osseous abnormality.
IMPRESSION: No acute findings in the imaged extracardiac chest.

## 2022-05-20 ENCOUNTER — Encounter: Payer: Self-pay | Admitting: Obstetrics & Gynecology

## 2022-06-02 ENCOUNTER — Ambulatory Visit: Payer: Self-pay

## 2022-06-03 ENCOUNTER — Ambulatory Visit (INDEPENDENT_AMBULATORY_CARE_PROVIDER_SITE_OTHER): Payer: Self-pay

## 2022-06-03 ENCOUNTER — Other Ambulatory Visit (HOSPITAL_BASED_OUTPATIENT_CLINIC_OR_DEPARTMENT_OTHER): Payer: Self-pay

## 2022-06-03 VITALS — BP 110/67 | HR 95 | Wt 136.0 lb

## 2022-06-03 DIAGNOSIS — Z3009 Encounter for other general counseling and advice on contraception: Secondary | ICD-10-CM

## 2022-06-03 DIAGNOSIS — Z3042 Encounter for surveillance of injectable contraceptive: Secondary | ICD-10-CM

## 2022-06-03 NOTE — Progress Notes (Cosign Needed)
Date last pap: 03-11-22. Last Depo-Provera: 03-11-22. Side Effects if any: none. Serum HCG indicated? NA. Depo-Provera 150 mg IM given by: Sharlene Dory RN. Next appointment due 3 months. [name], RN .

## 2022-06-23 ENCOUNTER — Ambulatory Visit: Payer: Self-pay | Admitting: *Deleted

## 2022-06-23 VITALS — BP 108/74 | Wt 137.7 lb

## 2022-06-23 DIAGNOSIS — Z1239 Encounter for other screening for malignant neoplasm of breast: Secondary | ICD-10-CM

## 2022-06-23 DIAGNOSIS — R87612 Low grade squamous intraepithelial lesion on cytologic smear of cervix (LGSIL): Secondary | ICD-10-CM

## 2022-06-23 NOTE — Progress Notes (Signed)
[name] is a 26 y.o. female who presents to [facility] clinic today with no complaints. Patient referred to [facility] due having an abnormal Pap smear 03/11/2022 that a colposcopy is recommended for follow up.   Pap Smear: Pap smear not completed today. Last Pap smear was 03/11/2022 at [name] in [name] and was abnormal - LSIL . Per patient has history of two other abnormal Pap smears 6/27/20233 that was LSIL that a colposcopy was completed 04/21/2021 that was LSIL and 11/10/2018 that was LSIL that no follow up was completed. Last Pap smear result is available in Epic.   Physical exam: Breasts Breasts symmetrical. No skin abnormalities bilateral breasts. No nipple retraction bilateral breasts. No nipple discharge bilateral breasts. No lymphadenopathy. No lumps palpated bilateral breasts. No complaints of pain or tenderness on exam. Screening mammogram recommended at age 68 unless clinically indicated prior.      Pelvic/Bimanual Pap is not indicated today per BCCCP guidelines.   Smoking History: Patient has never smoked.   Patient Navigation: Patient education provided. Access to services provided for patient through BCCCP program.   Breast and Cervical Cancer Risk Assessment: Patient does not have family history of breast cancer, known genetic mutations, or radiation treatment to the chest before age 59. Patient has history of cervical dysplasia. Patient has no history of being immunocompromised or DES exposure in-utero. Breast cancer risk assessment completed. No breast cancer risk calculated due to patient is less than 71 years old.  Risk Assessment     Risk Scores       06/23/2022   Last edited by: Royston Bake, CMA   5-year risk:    Lifetime risk:             A: BCCCP exam without pap smear No complaints.  P: Referred patient to the [facility] at [facility] for a colposcopy to follow up for her  abnormal Pap smear. Appointment scheduled Wednesday, July 22, 2022 at 1510.  Loletta Parish, RN 06/23/2022 10:37 AM

## 2022-06-23 NOTE — Patient Instructions (Signed)
Explained breast self awareness with Jessica Potter. Patient did not need a Pap smear today due to last Pap smear was 03/11/2022. Explained the colposcopy the recommended follow up for her abnormal Pap smear. Referred patient to the [facility] for [facility] at [facility] for a colposcopy to follow up for her abnormal Pap smear. Appointment scheduled Wednesday, July 22, 2022 at 1510. Patient aware of appointment and will be there. Let patient know a screening mammogram is recommended at age 33 unless clinically indicated prior. Jessica Potter verbalized understanding.  Jessica Potter, Arvil Chaco, RN 10:37 AM

## 2022-07-22 ENCOUNTER — Other Ambulatory Visit (HOSPITAL_COMMUNITY)
Admission: RE | Admit: 2022-07-22 | Discharge: 2022-07-22 | Disposition: A | Discharge status: Home / Self Care | Payer: Self-pay | Source: Ambulatory Visit | Attending: Obstetrics & Gynecology | Admitting: Obstetrics & Gynecology

## 2022-07-22 ENCOUNTER — Encounter: Payer: Self-pay | Admitting: Obstetrics & Gynecology

## 2022-07-22 ENCOUNTER — Ambulatory Visit (INDEPENDENT_AMBULATORY_CARE_PROVIDER_SITE_OTHER): Payer: Self-pay | Admitting: Obstetrics & Gynecology

## 2022-07-22 VITALS — BP 117/81 | HR 117 | Resp 16 | Ht 63.0 in | Wt 136.0 lb

## 2022-07-22 DIAGNOSIS — R87612 Low grade squamous intraepithelial lesion on cytologic smear of cervix (LGSIL): Secondary | ICD-10-CM | POA: Insufficient documentation

## 2022-07-22 DIAGNOSIS — D060 Carcinoma in situ of endocervix: Secondary | ICD-10-CM

## 2022-07-22 DIAGNOSIS — R87619 Unspecified abnormal cytological findings in specimens from cervix uteri: Secondary | ICD-10-CM

## 2022-07-22 DIAGNOSIS — R87613 High grade squamous intraepithelial lesion on cytologic smear of cervix (HGSIL): Secondary | ICD-10-CM

## 2022-07-22 LAB — POCT URINE PREGNANCY: Preg Test, Ur: NEGATIVE

## 2022-07-22 NOTE — Progress Notes (Signed)
Patient given informed consent, signed copy in the chart, time out was performed.  Placed in lithotomy position. Cervix viewed with speculum and colposcope after application of acetic acid.    Colposcopy adequate?  yes Acetowhite lesions?  yes Punctation?  no Mosaicism?   no Abnormal vasculature?  no Biopsies?  yes ECC?  yes  Patient was given post procedure instructions.  She will return in 2 weeks for results.  Ivann Trimarco L. Harraway-Smith, M.D., [place] [place]

## 2022-07-24 LAB — SURGICAL PATHOLOGY

## 2022-07-29 ENCOUNTER — Encounter: Payer: Self-pay | Admitting: Obstetrics & Gynecology

## 2022-07-30 ENCOUNTER — Telehealth: Payer: Self-pay

## 2022-07-30 NOTE — Telephone Encounter (Signed)
Attempted to contact patient regarding [employer] application. Left message on voicemail requesting a return call.  

## 2022-07-31 ENCOUNTER — Telehealth: Payer: Self-pay

## 2022-07-31 NOTE — Telephone Encounter (Signed)
Patient called and made aware her dysplasia has progressed and I informed her that there is a need for LEEP procedure. Patient scheduled for this with dr. Erin Fulling. Armandina Stammer RN

## 2022-07-31 NOTE — Telephone Encounter (Signed)
-----   Message from Jessica Rosenthal, MD sent at 07/30/2022  4:55 PM EST ----- Please call pt. Her dysplasia has progressed and I recommend a LEEP. Please notify pt and schedule procedure.   Thx,  Clh-S

## 2022-09-09 ENCOUNTER — Encounter: Payer: Self-pay | Admitting: General Practice

## 2022-09-09 ENCOUNTER — Encounter: Payer: Self-pay | Admitting: Obstetrics & Gynecology

## 2022-09-09 ENCOUNTER — Other Ambulatory Visit (HOSPITAL_COMMUNITY)
Admission: RE | Admit: 2022-09-09 | Discharge: 2022-09-09 | Disposition: A | Discharge status: Home / Self Care | Payer: Self-pay | Source: Ambulatory Visit | Attending: Obstetrics & Gynecology | Admitting: Obstetrics & Gynecology

## 2022-09-09 ENCOUNTER — Ambulatory Visit (INDEPENDENT_AMBULATORY_CARE_PROVIDER_SITE_OTHER): Payer: Self-pay | Admitting: Obstetrics & Gynecology

## 2022-09-09 VITALS — BP 109/64 | HR 99 | Wt 133.0 lb

## 2022-09-09 DIAGNOSIS — R87613 High grade squamous intraepithelial lesion on cytologic smear of cervix (HGSIL): Secondary | ICD-10-CM

## 2022-09-09 NOTE — Progress Notes (Signed)
Pap smear and colposcopy reviewed.   Pap 03/11/2022  Adequacy Satisfactory for evaluation; transformation zone component PRESENT.  Diagnosis - Low grade squamous intraepithelial lesion (LSIL) Abnormal    Colpo Biopsy FINAL MICROSCOPIC DIAGNOSIS:   A. CERVIX, 5:00, BIOPSY:  Focal severe squamous dysplasia with mild dysplasia showing HPV effect  (HSIL, CIN-3)  Chronic and follicular cervicitis with squamous metaplasia   B. ENDOCERVIX, CURETTAGE:  Minute detached fragment of high-grade squamous dysplasia (HSIL,  CIN-2/3)  Benign endocervical and ectocervical epithelium and benign endocervix    Risks, benefits, alternatives, and limitations of procedure explained to patient, including pain, bleeding, infection, failure to remove abnormal tissue and failure to cure dysplasia, need for repeat procedures, damage to pelvic organs, cervical incompetence.  Role of HPV,cervical dysplasia and need for close followup was empasized. Informed written consent was obtained. All questions were answered. Time out performed.  ??Procedure: The patient was placed in lithotomy position and the bivalved coated speculum was placed in the patient's vagina. A grounding pad placed on the patient. Local anesthesia was administered via an intracervical block using 12cc of 2% Lidocaine with epinephrine. The suction was turned on and the Small 1X Fisher Cone Biopsy Excisor on 95 Watts of cutting current was used to excise the area of decreased uptake and excise the entire transformation zone. Excellent hemostasis was achieved using roller ball coagulation set at 60 Watts coagulation current. Monsel's solution was then applied and excellent hemostasis was noted.  The speculum was removed from the vagina. Specimens were sent to pathology. ?The patient tolerated the procedure well. Post-operative instructions given to patient, including instruction to seek medical attention for persistent bright red bleeding, fever,  abdominal/pelvic pain, dysuria, nausea or vomiting. She was also told about the possibility of having copious yellow to black tinged discharge. She was counseled to avoid anything in the vagina (sex/douching/tampons) for 4 weeks. She has a  4 week post-operative check to review results and assess wound healing. Follow up in 6 months for repeat pap or as needed.  Ohana Birdwell L. Harraway-Smith, M.D., [place] [place]

## 2022-09-09 NOTE — Patient Instructions (Signed)
LEEP POST-PROCEDURE INSTRUCTIONS  You may take Ibuprofen, Aleve or Tylenol for pain if needed.  Cramping is normal.  You will have black and/or bloody discharge at first.  This will lighten and then turn clear before completely resolving.  This will take 2 to 3 weeks.  Put nothing in your vagina until the bleeding or discharge stops (usually 2 or3 days).  You need to call if you have redness around the biopsy site, if there is any unusual draining, if the bleeding is heavy, or if you are concerned.  Shower or bathe as normal  We will call you within one week with results or we will discuss the results at your follow-up appointment if needed.  You will need to return for a follow-up Pap smear as directed by your physician. 

## 2022-09-14 HISTORY — PX: CERVICAL BIOPSY  W/ LOOP ELECTRODE EXCISION: SUR135

## 2022-09-15 LAB — SURGICAL PATHOLOGY

## 2022-09-25 ENCOUNTER — Telehealth: Payer: Self-pay

## 2022-09-25 NOTE — Telephone Encounter (Signed)
Contacted patient regarding completing [employer] Medicaid application, stated currently at work, not able to talk, given contact information, will return call.

## 2022-10-01 ENCOUNTER — Encounter: Payer: Self-pay | Admitting: General Practice

## 2022-10-07 ENCOUNTER — Ambulatory Visit (INDEPENDENT_AMBULATORY_CARE_PROVIDER_SITE_OTHER): Payer: 59 | Admitting: Obstetrics & Gynecology

## 2022-10-07 ENCOUNTER — Encounter: Payer: Self-pay | Admitting: Obstetrics & Gynecology

## 2022-10-07 VITALS — BP 110/74 | HR 104 | Ht 63.0 in | Wt 129.0 lb

## 2022-10-07 DIAGNOSIS — N879 Dysplasia of cervix uteri, unspecified: Secondary | ICD-10-CM | POA: Diagnosis not present

## 2022-10-07 NOTE — Progress Notes (Signed)
Patient presents for follow up from her colposcopy. [name] RN

## 2022-10-07 NOTE — Progress Notes (Signed)
History:  27 y.o. G0P0000 here today for post LEEP check. Pt reports weird discharge for 1 year but, now her she has no abnormal sx.  Pt did get the full HPV vaccine series as a child.    The following portions of the patient's history were reviewed and updated as appropriate: allergies, current medications, past family history, past medical history, past social history, past surgical history and problem list.  Review of Systems:  Pertinent items are noted in HPI.    Objective:  Physical Exam Blood pressure 110/74, pulse (!) 104, height 5\' 3"  (1.6 m), weight 129 lb (58.5 kg), last menstrual period 10/02/2022.  CONSTITUTIONAL: Well-developed, well-nourished female in no acute distress.  HENT:  Normocephalic, atraumatic EYES: Conjunctivae and EOM are normal. No scleral icterus.  NECK: Normal range of motion SKIN: Skin is warm and dry. No rash noted. Not diaphoretic.No pallor. [place]: Alert and oriented to person, place, and time. Normal coordination.  Pelvic: Normal appearing external genitalia; normal appearing vaginal mucosa. The cervix is well healed. Normal discharge.   Labs and Imaging 09/09/2022 Clinical History: High grade dysplasia on colpo biopsies ([facility])   FINAL MICROSCOPIC DIAGNOSIS:   A. CERVIX, LEEP:  Low-grade squamous intraepithelial lesion, CIN-1 (low grade dysplasia).  Margins negative for dysplasia.    Assessment & Plan:  Post LEEP check. Doing well. Margins neg.   F/u in 1 year for PAP  F/u sooner prn   Laticia Vannostrand L. Harraway-Smith, M.D., Cherlynn June

## 2022-12-29 ENCOUNTER — Encounter: Payer: Self-pay | Admitting: Allergy

## 2022-12-29 ENCOUNTER — Ambulatory Visit (INDEPENDENT_AMBULATORY_CARE_PROVIDER_SITE_OTHER): Payer: 59 | Admitting: Allergy

## 2022-12-29 ENCOUNTER — Other Ambulatory Visit: Payer: Self-pay

## 2022-12-29 VITALS — BP 110/62 | HR 100 | Temp 98.0°F | Resp 20 | Ht 63.0 in | Wt 129.0 lb

## 2022-12-29 DIAGNOSIS — J3089 Other allergic rhinitis: Secondary | ICD-10-CM | POA: Diagnosis not present

## 2022-12-29 DIAGNOSIS — H1010 Acute atopic conjunctivitis, unspecified eye: Secondary | ICD-10-CM

## 2022-12-29 DIAGNOSIS — H1013 Acute atopic conjunctivitis, bilateral: Secondary | ICD-10-CM | POA: Diagnosis not present

## 2022-12-29 DIAGNOSIS — J302 Other seasonal allergic rhinitis: Secondary | ICD-10-CM

## 2022-12-29 MED ORDER — AZELASTINE HCL 0.05 % OP SOLN: 1.0000 [drp] | Freq: Two times a day (BID) | OPHTHALMIC | 3 refills | Status: DC | PRN

## 2022-12-29 MED ORDER — MONTELUKAST SODIUM 10 MG PO TABS: 10.0000 mg | ORAL_TABLET | Freq: Every day | ORAL | 5 refills | Status: DC

## 2022-12-29 NOTE — Progress Notes (Signed)
Follow Up Note  RE: Jessica Potter MRN: [id number] DOB: April 17, 1996 Date of Office Visit: 12/29/2022  Referring provider: Pearline Cables, MD Primary care provider: Pearline Cables, MD  Chief Complaint: Other (Pt states she has been having some itchy eyes that started about a week ago and states that it burns sometimes she states she has tried eye drops and don't seem to help.)  History of Present Illness: I had the pleasure of seeing Jessica Potter for a follow up visit at the [facility] on 12/29/2022. She is a 27 y.o. female, who is being followed for allergic rhino conjunctivitis and oral allergy syndrome. Her previous allergy office visit was on 03/12/2021 with Dr. Selena Potter. Today is a new complaint visit of itching .  Environmental allergies Having itchy eyes for the past 1 week. Does not wear contacts.  Currently taking allegra daily, clear eyes, cold compress with no benefit. Rhinitis symptoms controlled with allegra.  Works as an Social research officer, government delivery person.   Assessment and Plan: Jessica Potter is a 27 y.o. female with: Seasonal and perennial allergic rhinoconjunctivitis Past history - rhino conjunctivitis symptoms mainly in the spring. Tried Claritin, Allegra, Flonase and visine A with some benefit. No prior ENT evaluation. 2022 skin testing showed: Positive to grass, ragweed, weed pollen, trees, mold, dust mites, cat. Interim history - itchy eyes, outdoors a lot for work.  Continue environmental control measures as below. Use over the counter antihistamines such as Zyrtec (cetirizine), Claritin (loratadine), Allegra (fexofenadine), or Xyzal (levocetirizine) daily as needed. May take twice a day during allergy flares. May switch antihistamines every few months. Start Singulair (montelukast)  daily at night. Cautioned that in some children/adults can experience behavioral changes including hyperactivity, agitation, depression, sleep disturbances and suicidal  ideations. These side effects are rare, but if you notice them you should notify me and discontinue Singulair (montelukast). Use Optivar (azelastine) eye drops 0.05% twice a day as needed for itchy/watery eyes. OR Use olopatadine eye drops 0.2% once a day as needed for itchy/watery eyes. Sample given. This is over the counter.  Consider allergy injections for long term control if above medications do not help the symptoms.   Return in about 1 year (around 12/29/2023).  Meds ordered this encounter  Medications   azelastine (OPTIVAR) 0.05 % ophthalmic solution    Sig: Place 1 drop into both eyes 2 (two) times daily as needed (itchy/watery eyes).    Dispense:  6 mL    Refill:  3   montelukast (SINGULAIR) 10 MG tablet    Sig: Take 1 tablet (10 mg total) by mouth at bedtime.    Dispense:  30 tablet    Refill:  5   Lab Orders  No laboratory test(s) ordered today    Diagnostics: None.   Medication List:  Current Outpatient Medications  Medication Sig Dispense Refill   azelastine (OPTIVAR) 0.05 % ophthalmic solution Place 1 drop into both eyes 2 (two) times daily as needed (itchy/watery eyes). 6 mL 3   EPINEPHrine (EPIPEN 2-PAK) 0.3 mg/0.3 mL IJ SOAJ injection Inject 0.3 mg into the muscle as needed for anaphylaxis. 2 each PRN   fexofenadine (ALLEGRA) 180 MG tablet Take 1 tablet (180 mg total) by mouth daily. 90 tablet 3   ipratropium (ATROVENT) 0.06 % nasal spray Place 2 sprays into both nostrils 3 (three) times daily.     lidocaine (XYLOCAINE) 2 % jelly Apply 1 application topically as needed. 30 mL 2   montelukast (SINGULAIR) 10  MG tablet Take 1 tablet (10 mg total) by mouth at bedtime. 30 tablet 5   triamcinolone cream (KENALOG) 0.1 % Apply topically 2 (two) times daily. 30 g 3   medroxyPROGESTERone Acetate 150 MG/ML SUSY INJECT 1 ML (150 MG TOTAL) INTO THE MUSCLE EVERY 3 (THREE) MONTHS. 1 mL 3   Current Facility-Administered Medications  Medication Dose Route Frequency Provider Last  Rate Last Admin   medroxyPROGESTERone (DEPO-PROVERA) injection 150 mg  150 mg Intramuscular Q90 days Conan Bowens, MD   150 mg at 06/03/22 1003   Allergies: No Known Allergies I reviewed her past medical history, social history, family history, and environmental history and no significant changes have been reported from her previous visit.  Review of Systems  Constitutional:  Negative for appetite change, chills, fever and unexpected weight change.  HENT:  Negative for congestion and rhinorrhea.   Eyes:  Positive for itching.  Respiratory:  Negative for cough, chest tightness, shortness of breath and wheezing.   Cardiovascular:  Negative for chest pain.  Gastrointestinal:  Negative for abdominal pain.  Genitourinary:  Negative for difficulty urinating.  Skin:  Negative for rash.  Allergic/Immunologic: Positive for environmental allergies.  Neurological:  Negative for headaches.    Objective: BP 110/62   Pulse 100   Temp 98 F (36.7 C)   Resp 20   Ht  (1.6 m)   Wt 129 lb (58.5 kg)   SpO2 98%   BMI 22.85 kg/m  Body mass index is 22.85 kg/m. Physical Exam Vitals and nursing note reviewed.  Constitutional:      Appearance: Normal appearance. She is well-developed.  HENT:     Head: Normocephalic and atraumatic.     Right Ear: Tympanic membrane and external ear normal.     Left Ear: Tympanic membrane and external ear normal.     Nose: Nose normal.     Comments: Transverse nasal crease    Mouth/Throat:     Mouth: Mucous membranes are moist.     Pharynx: Oropharynx is clear.  Eyes:     Conjunctiva/sclera: Conjunctivae normal.  Cardiovascular:     Rate and Rhythm: Normal rate and regular rhythm.     Heart sounds: Normal heart sounds. No murmur heard.    No friction rub. No gallop.  Pulmonary:     Effort: Pulmonary effort is normal.     Breath sounds: Normal breath sounds. No wheezing, rhonchi or rales.  Musculoskeletal:     Cervical back: Neck supple.  Skin:     General: Skin is warm.     Findings: No rash.  Neurological:     Mental Status: She is alert and oriented to person, place, and time.  Psychiatric:        Behavior: Behavior normal.    Previous notes and tests were reviewed. The plan was reviewed with the patient/family, and all questions/concerned were addressed.  It was my pleasure to see Anjoli today and participate in her care. Please feel free to contact me with any questions or concerns.  Sincerely,  Wyline Mood, DO Allergy & Immunology  [facility] office: [phone] [facility] office: [phone]

## 2022-12-29 NOTE — Assessment & Plan Note (Signed)
Past history - rhino conjunctivitis symptoms mainly in the spring. Tried Claritin, Allegra, Flonase and visine A with some benefit. No prior ENT evaluation. 2022 skin testing showed: Positive to grass, ragweed, weed pollen, trees, mold, dust mites, cat. Interim history - itchy eyes, outdoors a lot for work.  Continue environmental control measures as below. Use over the counter antihistamines such as Zyrtec (cetirizine), Claritin (loratadine), Allegra (fexofenadine), or Xyzal (levocetirizine) daily as needed. May take twice a day during allergy flares. May switch antihistamines every few months. Start Singulair (montelukast)  daily at night. Cautioned that in some children/adults can experience behavioral changes including hyperactivity, agitation, depression, sleep disturbances and suicidal ideations. These side effects are rare, but if you notice them you should notify me and discontinue Singulair (montelukast). Use Optivar (azelastine) eye drops 0.05% twice a day as needed for itchy/watery eyes. OR Use olopatadine eye drops 0.2% once a day as needed for itchy/watery eyes. Sample given. This is over the counter.  Consider allergy injections for long term control if above medications do not help the symptoms.

## 2022-12-29 NOTE — Patient Instructions (Addendum)
Environmental allergies 2022 skin testing showed: Positive to grass, ragweed, weed pollen, trees, mold, dust mites, cat. Continue environmental control measures as below. Use over the counter antihistamines such as Zyrtec (cetirizine), Claritin (loratadine), Allegra (fexofenadine), or Xyzal (levocetirizine) daily as needed. May take twice a day during allergy flares. May switch antihistamines every few months. Start Singulair (montelukast)  daily at night. Cautioned that in some children/adults can experience behavioral changes including hyperactivity, agitation, depression, sleep disturbances and suicidal ideations. These side effects are rare, but if you notice them you should notify me and discontinue Singulair (montelukast).  Use Optivar (azelastine) eye drops 0.05% twice a day as needed for itchy/watery eyes. OR Use olopatadine eye drops 0.2% once a day as needed for itchy/watery eyes. Sample given. This is over the counter.  Consider allergy injections for long term control if above medications do not help the symptoms.   Follow up in 12 months or sooner if needed.    Reducing Pollen Exposure Pollen seasons: trees (spring), grass (summer) and ragweed/weeds (fall). Keep windows closed in your home and car to lower pollen exposure.  Install air conditioning in the bedroom and throughout the house if possible.  Avoid going out in dry windy days - especially early morning. Pollen counts are highest between 5 - 10 AM and on dry, hot and windy days.  Save outside activities for late afternoon or after a heavy rain, when pollen levels are lower.  Avoid mowing of grass if you have grass pollen allergy. Be aware that pollen can also be transported indoors on people and pets.  Dry your clothes in an automatic dryer rather than hanging them outside where they might collect pollen.  Rinse hair and eyes before bedtime. Mold Control Mold and fungi can grow on a variety of surfaces provided  certain temperature and moisture conditions exist.  Outdoor molds grow on plants, decaying vegetation and soil. The major outdoor mold, Alternaria and Cladosporium, are found in very high numbers during hot and dry conditions. Generally, a late summer - fall peak is seen for common outdoor fungal spores. Rain will temporarily lower outdoor mold spore count, but counts rise rapidly when the rainy period ends. The most important indoor molds are Aspergillus and Penicillium. Dark, humid and poorly ventilated basements are ideal sites for mold growth. The next most common sites of mold growth are the bathroom and the kitchen. Outdoor (Seasonal) Mold Control Use air conditioning and keep windows closed. Avoid exposure to decaying vegetation. Avoid leaf raking. Avoid grain handling. Consider wearing a face mask if working in moldy areas.  Indoor (Perennial) Mold Control  Maintain humidity below 50%. Get rid of mold growth on hard surfaces with water, detergent and, if necessary, 5% bleach (do not mix with other cleaners). Then dry the area completely. If mold covers an area more than 10 square feet, consider hiring an indoor environmental professional. For clothing, washing with soap and water is best. If moldy items cannot be cleaned and dried, throw them away. Remove sources e.g. contaminated carpets. Repair and seal leaking roofs or pipes. Using dehumidifiers in damp basements may be helpful, but empty the water and clean units regularly to prevent mildew from forming. All rooms, especially basements, bathrooms and kitchens, require ventilation and cleaning to deter mold and mildew growth. Avoid carpeting on concrete or damp floors, and storing items in damp areas. Control of House Dust Mite Allergen Dust mite allergens are a common trigger of allergy and asthma symptoms. While they can be  found throughout the house, these microscopic creatures thrive in warm, humid environments such as bedding,  upholstered furniture and carpeting. Because so much time is spent in the bedroom, it is essential to reduce mite levels there.  Encase pillows, mattresses, and box springs in special allergen-proof fabric covers or airtight, zippered plastic covers.  Bedding should be washed weekly in hot water (130 F) and dried in a hot dryer. Allergen-proof covers are available for comforters and pillows that can't be regularly washed.  Wash the allergy-proof covers every few months. Minimize clutter in the bedroom. Keep pets out of the bedroom.  Keep humidity less than 50% by using a dehumidifier or air conditioning. You can buy a humidity measuring device called a hygrometer to monitor this.  If possible, replace carpets with hardwood, linoleum, or washable area rugs. If that's not possible, vacuum frequently with a vacuum that has a HEPA filter. Remove all upholstered furniture and non-washable window drapes from the bedroom. Remove all non-washable stuffed toys from the bedroom.  Wash stuffed toys weekly. Pet Allergen Avoidance: Contrary to popular opinion, there are no "hypoallergenic" breeds of dogs or cats. That is because people are not allergic to an animal's hair, but to an allergen found in the animal's saliva, dander (dead skin flakes) or urine. Pet allergy symptoms typically occur within minutes. For some people, symptoms can build up and become most severe 8 to 12 hours after contact with the animal. People with severe allergies can experience reactions in public places if dander has been transported on the pet owners' clothing. Keeping an animal outdoors is only a partial solution, since homes with pets in the yard still have higher concentrations of animal allergens. Before getting a pet, ask your allergist to determine if you are allergic to animals. If your pet is already considered part of your family, try to minimize contact and keep the pet out of the bedroom and other rooms where you spend a  great deal of time. As with dust mites, vacuum carpets often or replace carpet with a hardwood floor, tile or linoleum. High-efficiency particulate air (HEPA) cleaners can reduce allergen levels over time. While dander and saliva are the source of cat and dog allergens, urine is the source of allergens from rabbits, hamsters, mice and [place] pigs; so ask a non-allergic family member to clean the animal's cage. If you have a pet allergy, talk to your allergist about the potential for allergy immunotherapy (allergy shots). This strategy can often provide long-term relief.

## 2022-12-31 ENCOUNTER — Ambulatory Visit: Payer: Self-pay | Admitting: Allergy

## 2023-01-29 ENCOUNTER — Telehealth: Payer: Self-pay

## 2023-01-29 NOTE — Telephone Encounter (Signed)
Mailed letter and [employer] Medicaid application to sign with return envelope.

## 2023-05-10 NOTE — Progress Notes (Unsigned)
[facility] at [facility] [address], [address] [place], [place] [address] [phone]  Date:  05/12/2023   Name:  Jessica Potter   DOB:  [birth year]   MRN:  [id number]  PCP:  Pearline Cables, MD    Chief Complaint: No chief complaint on file.   History of Present Illness:  Jessica Potter is a 26 y.o. very pleasant female patient who presents with the following:  Patient seen today for physical exam Most recent visit with myself was June 2022  She had several LSIL Paps in a row, I sent her to GYN after her Pap in 2022 She saw gynecology last year, ended up undergoing a LEEP procedure last December  It looks like Dr. Erin Fulling wanted to do a repeat Pap in 6 months at that time  She was also seen in the ER on August 12 with near syncope, chest pain-this was thought due to dehydration.  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   Eczema    Vaginal Pap smear, abnormal 2020   Flexural eczema 02/01/2017   Seasonal and perennial allergic rhinoconjunctivitis 04/14/2016    Past Medical History:  Diagnosis Date   Eczema    Flexural eczema 02/01/2017   Seasonal allergic rhinitis 04/14/2016   Vaginal Pap smear, abnormal 2020   LGSIL    Past Surgical History:  Procedure Laterality Date   NO PAST SURGERIES     WISDOM TOOTH EXTRACTION Bilateral 01/15/2022    Social History   Tobacco Use   Smoking status: Never   Smokeless tobacco: Never  Vaping Use   Vaping status: Never Used  Substance Use Topics   Alcohol use: No   Drug use: No    Family History  Problem Relation Age of Onset   Diabetes Maternal Grandmother     No Known Allergies  Medication list has been reviewed and updated.  Current Outpatient Medications on File Prior to Visit   Medication Sig Dispense Refill   azelastine (OPTIVAR) 0.05 % ophthalmic solution Place 1 drop into both eyes 2 (two) times daily as needed (itchy/watery eyes). 6 mL 3   EPINEPHrine (EPIPEN 2-PAK) 0.3 mg/0.3 mL IJ SOAJ injection Inject 0.3 mg into the muscle as needed for anaphylaxis. 2 each PRN   fexofenadine (ALLEGRA) 180 MG tablet Take 1 tablet (180 mg total) by mouth daily. 90 tablet 3   ipratropium (ATROVENT) 0.06 % nasal spray Place 2 sprays into both nostrils 3 (three) times daily.     lidocaine (XYLOCAINE) 2 % jelly Apply 1 application topically as needed. 30 mL 2   medroxyPROGESTERone Acetate 150 MG/ML SUSY INJECT 1 ML (150 MG TOTAL) INTO THE MUSCLE EVERY 3 (THREE) MONTHS. 1 mL 3   montelukast (SINGULAIR) 10 MG tablet Take 1 tablet (10 mg total) by mouth at bedtime. 30 tablet 5   triamcinolone cream (KENALOG) 0.1 % Apply topically 2 (two) times daily. 30 g 3   [DISCONTINUED] metoprolol tartrate (LOPRESSOR) 100 MG tablet Take 1 tablet (100 mg total) by mouth once for 1 dose. Take 2 hours prior to your CT if your heart rate is greater than 55 1 tablet 0   Current Facility-Administered Medications on File Prior to Visit  Medication Dose Route Frequency Provider Last Rate Last Admin   medroxyPROGESTERone (DEPO-PROVERA)  injection 150 mg  150 mg Intramuscular Q90 days Conan Bowens, MD   150 mg at 06/03/22 1003    Review of Systems:  As per HPI- otherwise negative.   Physical Examination: There were no vitals filed for this visit. There were no vitals filed for this visit. There is no height or weight on file to calculate BMI. Ideal Body Weight:    GEN: no acute distress. HEENT: Atraumatic, Normocephalic.  Ears and Nose: No external deformity. CV: RRR, No M/G/R. No JVD. No thrill. No extra heart sounds. PULM: CTA B, no wheezes, crackles, rhonchi. No retractions. No resp. distress. No accessory muscle use. ABD: S, NT, ND, +BS. No rebound. No HSM. EXTR: No c/c/e PSYCH: Normally  interactive. Conversant.    Assessment and Plan: *** Physical exam today.  Encouraged healthy diet and exercise routine Signed Abbe Amsterdam, MD

## 2023-05-10 NOTE — Patient Instructions (Signed)
It was great to see you again today.  Recommend COVID booster and flu shot this fall  I will be in touch with your labs asap

## 2023-05-12 ENCOUNTER — Ambulatory Visit (INDEPENDENT_AMBULATORY_CARE_PROVIDER_SITE_OTHER): Payer: 59 | Admitting: Family Medicine

## 2023-05-12 VITALS — BP 112/60 | HR 100 | Temp 97.6°F | Resp 18 | Ht 63.0 in | Wt 122.4 lb

## 2023-05-12 DIAGNOSIS — Z13220 Encounter for screening for lipoid disorders: Secondary | ICD-10-CM

## 2023-05-12 DIAGNOSIS — Z131 Encounter for screening for diabetes mellitus: Secondary | ICD-10-CM

## 2023-05-12 DIAGNOSIS — Z0000 Encounter for general adult medical examination without abnormal findings: Secondary | ICD-10-CM

## 2023-05-12 DIAGNOSIS — Z1329 Encounter for screening for other suspected endocrine disorder: Secondary | ICD-10-CM | POA: Diagnosis not present

## 2023-05-12 DIAGNOSIS — R5383 Other fatigue: Secondary | ICD-10-CM

## 2023-05-12 DIAGNOSIS — N920 Excessive and frequent menstruation with regular cycle: Secondary | ICD-10-CM

## 2023-05-12 DIAGNOSIS — R52 Pain, unspecified: Secondary | ICD-10-CM | POA: Diagnosis not present

## 2023-05-12 DIAGNOSIS — Z130 Encounter for screening for diseases of the blood and blood-forming organs and certain disorders involving the immune mechanism: Secondary | ICD-10-CM

## 2023-05-12 LAB — POCT RAPID STREP A (OFFICE): Rapid Strep A Screen: NEGATIVE

## 2023-05-12 LAB — LIPID PANEL
Cholesterol: 144 mg/dL (ref 0–200)
HDL: 46.1 mg/dL (ref >39.00)
LDL Cholesterol: 88 mg/dL (ref 0–99)
NonHDL: 98.06
Total CHOL/HDL Ratio: 3
Triglycerides: 52 mg/dL (ref 0.0–149.0)
VLDL: 10.4 mg/dL (ref 0.0–40.0)

## 2023-05-12 LAB — COMPREHENSIVE METABOLIC PANEL
ALT: 11 U/L (ref 0–35)
AST: 12 U/L (ref 0–37)
Albumin: 4.2 g/dL (ref 3.5–5.2)
Alkaline Phosphatase: 71 U/L (ref 39–117)
BUN: 6 mg/dL (ref 6–23)
CO2: 27 meq/L (ref 19–32)
Calcium: 9.2 mg/dL (ref 8.4–10.5)
Chloride: 104 meq/L (ref 96–112)
Creatinine, Ser: 0.72 mg/dL (ref 0.40–1.20)
GFR: 116.01 mL/min (ref >60.00)
Glucose, Bld: 80 mg/dL (ref 70–99)
Potassium: 4.1 meq/L (ref 3.5–5.1)
Sodium: 140 meq/L (ref 135–145)
Total Bilirubin: 0.3 mg/dL (ref 0.2–1.2)
Total Protein: 6.8 g/dL (ref 6.0–8.3)

## 2023-05-12 LAB — CBC
HCT: 40 % (ref 36.0–46.0)
Hemoglobin: 12.8 g/dL (ref 12.0–15.0)
MCHC: 32.1 g/dL (ref 30.0–36.0)
MCV: 93.6 fl (ref 78.0–100.0)
Platelets: 261 10*3/uL (ref 150.0–400.0)
RBC: 4.27 Mil/uL (ref 3.87–5.11)
RDW: 13 % (ref 11.5–15.5)
WBC: 6.2 10*3/uL (ref 4.0–10.5)

## 2023-05-12 LAB — HEMOGLOBIN A1C: Hgb A1c MFr Bld: 5.4 % (ref 4.6–6.5)

## 2023-05-12 LAB — POC COVID19 BINAXNOW: SARS Coronavirus 2 Ag: NEGATIVE

## 2023-05-13 LAB — VITAMIN D 25 HYDROXY (VIT D DEFICIENCY, FRACTURES): VITD: 28.93 ng/mL — ABNORMAL LOW (ref 30.00–100.00)

## 2023-05-13 LAB — FERRITIN: Ferritin: 74.4 ng/mL (ref 10.0–291.0)

## 2023-05-13 LAB — TSH: TSH: 1.86 u[IU]/mL (ref 0.35–5.50)

## 2023-08-01 NOTE — Progress Notes (Unsigned)
[facility] at [facility] [address] [place], [place] [address] [phone]  Date:  08/04/2023   Name:  Morningstar Borns   DOB:  Mar 21, 1996   MRN:  [id number]  PCP:  Pearline Cables, MD    Chief Complaint: No chief complaint on file.   History of Present Illness:  Jadyn Bujak is a 27 y.o. very pleasant female patient who presents with the following:  Patient seen today with concern of cough Most recent visit with myself was in August for her physical exam  She has history of abnormal Pap, she did a LEEP procedure last December Per Dr. 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   Eczema    Vaginal Pap smear, abnormal 2020   Flexural eczema 02/01/2017   Seasonal and perennial allergic rhinoconjunctivitis 04/14/2016    Past Medical History:  Diagnosis Date   Eczema    Flexural eczema 02/01/2017   Seasonal allergic rhinitis 04/14/2016   Vaginal Pap smear, abnormal 2020   LGSIL    Past Surgical History:  Procedure Laterality Date   NO PAST SURGERIES     WISDOM TOOTH EXTRACTION Bilateral 01/15/2022    Social History   Tobacco Use   Smoking status: Never   Smokeless tobacco: Never  Vaping Use   Vaping status: Never Used  Substance Use Topics   Alcohol use: No   Drug use: No    Family History  Problem Relation Age of Onset   Diabetes Maternal Grandmother     No Known Allergies  Medication list has been reviewed and updated.  Current Outpatient Medications on File Prior to Visit  Medication Sig Dispense Refill   EPINEPHrine (EPIPEN 2-PAK) 0.3 mg/0.3 mL IJ SOAJ injection Inject 0.3 mg into the muscle as needed for anaphylaxis. 2  each PRN   fexofenadine (ALLEGRA) 180 MG tablet Take 1 tablet (180 mg total) by mouth daily. 90 tablet 3   montelukast (SINGULAIR) 10 MG tablet Take 1 tablet (10 mg total) by mouth at bedtime. 30 tablet 5   [DISCONTINUED] metoprolol tartrate (LOPRESSOR) 100 MG tablet Take 1 tablet (100 mg total) by mouth once for 1 dose. Take 2 hours prior to your CT if your heart rate is greater than 55 1 tablet 0   Current Facility-Administered Medications on File Prior to Visit  Medication Dose Route Frequency Provider Last Rate Last Admin   medroxyPROGESTERone (DEPO-PROVERA) injection 150 mg  150 mg Intramuscular Q90 days Conan Bowens, MD   150 mg at 06/03/22 1003    Review of Systems:  As per HPI- otherwise negative.   Physical Examination: There were no vitals filed for this visit. There were no vitals filed for this visit. There is no height or weight on file to calculate BMI. Ideal Body Weight:    GEN: no acute distress. HEENT: Atraumatic, Normocephalic.  Ears and Nose: No external deformity. CV: RRR, No M/G/R. No JVD. No thrill. No extra heart sounds. PULM: CTA B, no wheezes, crackles, rhonchi. No retractions. No resp. distress. No accessory muscle use. ABD: S, NT, ND, +BS. No rebound. No HSM. EXTR: No c/c/e PSYCH: Normally interactive. Conversant.  Assessment and Plan: ***  Signed Abbe Amsterdam, MD

## 2023-08-04 ENCOUNTER — Other Ambulatory Visit (HOSPITAL_BASED_OUTPATIENT_CLINIC_OR_DEPARTMENT_OTHER): Payer: Self-pay

## 2023-08-04 ENCOUNTER — Encounter: Payer: Self-pay | Admitting: Family Medicine

## 2023-08-04 ENCOUNTER — Ambulatory Visit (INDEPENDENT_AMBULATORY_CARE_PROVIDER_SITE_OTHER): Payer: 59 | Admitting: Family Medicine

## 2023-08-04 VITALS — BP 100/60 | HR 96 | Temp 98.2°F | Resp 18 | Ht 63.0 in | Wt 115.6 lb

## 2023-08-04 DIAGNOSIS — J209 Acute bronchitis, unspecified: Secondary | ICD-10-CM

## 2023-08-04 DIAGNOSIS — S46911A Strain of unspecified muscle, fascia and tendon at shoulder and upper arm level, right arm, initial encounter: Secondary | ICD-10-CM | POA: Diagnosis not present

## 2023-08-04 MED ORDER — MELOXICAM 15 MG PO TABS
15.0000 mg | ORAL_TABLET | Freq: Every day | ORAL | 0 refills | Status: AC
Start: 2023-08-04 — End: ?
  Filled 2023-08-04: qty 30, 30d supply, fill #0

## 2023-08-04 MED ORDER — AZITHROMYCIN 250 MG PO TABS
ORAL_TABLET | ORAL | 0 refills | Status: DC
Start: 2023-08-04 — End: 2023-08-08
  Filled 2023-08-04: qty 6, 5d supply, fill #0

## 2023-08-04 NOTE — Patient Instructions (Addendum)
It was good to see you again today- we will have you try azithromycin/ zpack for bronchitis.  Please let me know if this is not resolving cough in the next week or so I gave you meloxicam to use as needed for shoulder pain.  Try to rest your shoulder is much as you can for the next few days and avoid lifting anything overhead.  Let me know if this does not get better

## 2023-08-08 ENCOUNTER — Other Ambulatory Visit: Payer: Self-pay

## 2023-08-08 ENCOUNTER — Ambulatory Visit
Admission: EM | Admit: 2023-08-08 | Discharge: 2023-08-08 | Disposition: A | Discharge status: Home / Self Care | Payer: 59 | Attending: Family Medicine | Admitting: Family Medicine

## 2023-08-08 DIAGNOSIS — M2550 Pain in unspecified joint: Secondary | ICD-10-CM

## 2023-08-08 DIAGNOSIS — R5081 Fever presenting with conditions classified elsewhere: Secondary | ICD-10-CM | POA: Diagnosis not present

## 2023-08-08 LAB — POCT RAPID STREP A (OFFICE): Rapid Strep A Screen: NEGATIVE

## 2023-08-08 MED ORDER — PREDNISONE 20 MG PO TABS: ORAL_TABLET | ORAL | 0 refills | Status: AC

## 2023-08-08 NOTE — ED Provider Notes (Signed)
[facility]    CSN: [id number] Arrival date & time: 08/08/23  1038      History   Chief Complaint Chief Complaint  Patient presents with   Hand Pain    HPI Jessica Potter is a 27 y.o. female.   Jessica Potter is a pleasant 27 year old woman.  Generally in good health.  Saw her primary care doctor just 4 days ago and was started on meloxicam for a sore shoulder.  She was also given azithromycin for a cough.  She states that both of these problems have completely resolved.  She states that she saw her on Wednesday, took the medication starting Wednesday night Friday felt well and Saturday woke up with swelling in her hands and wrists.  Right wrist, and both hands have swollen joints that are red, painful, and hurt to move.  She is unable to do her job at [employer].  States she is achy in general.  States it started with a fever to 100.4 that is resolved. Denies sore throat or strep infection.  Denies tick bite.  Denies any urinary symptoms, vaginal symptoms, possibility of STD (gonorrhea). No history of arthritis.  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 Authorizing Provider  predniSONE (DELTASONE) 20 MG tablet Take 2 a day for 5 days then 1 a day for 5 days then discontinue 08/08/23  Yes Eustace Moore, MD  EPINEPHrine (EPIPEN 2-PAK) 0.3 mg/0.3 mL IJ SOAJ injection Inject 0.3 mg into the muscle as needed for anaphylaxis. 03/10/21   Copland, Gwenlyn Found, MD  fexofenadine (ALLEGRA) 180 MG tablet Take 1 tablet (180 mg total) by mouth daily. 12/31/21   Copland, Gwenlyn Found, MD  meloxicam (MOBIC) 15 MG tablet Take 1 tablet (15 mg total) by mouth daily. Take as needed for shoulder pain 08/04/23   Copland, Gwenlyn Found, MD  metoprolol tartrate (LOPRESSOR) 100 MG tablet Take 1 tablet (100 mg total) by mouth once for 1 dose. Take 2 hours prior to your CT if your heart rate is greater than 55 09/04/20 10/03/20  Tobb, Kardie, DO    Family History Family History  Problem Relation Age of Onset   Diabetes Maternal Grandmother     Social History Social History   Tobacco Use   Smoking status: Never  Smokeless tobacco: Never  Vaping Use   Vaping status: Never Used  Substance Use Topics   Alcohol use: No   Drug use: No     Allergies   No known allergies   Review of Systems Review of Systems See HPI  Physical Exam Triage Vital Signs ED Triage Vitals  Encounter Vitals Group     BP 08/08/23 1046 114/78     Systolic BP Percentile --      Diastolic BP Percentile --      Pulse Rate 08/08/23 1046 84     Resp 08/08/23 1046 16     Temp 08/08/23 1046 98.2 F (36.8 C)     Temp Source 08/08/23 1046 Oral     SpO2 08/08/23 1046 100 %     Weight --      Height --      Head Circumference --      Peak Flow --      Pain Score 08/08/23 1049 4     Pain Loc --      Pain Education --      Exclude from Growth Chart --    No data found.  Updated Vital Signs BP 114/78   Pulse 84   Temp 98.2 F (36.8 C) (Oral)   Resp 16   SpO2 100%       Physical Exam Constitutional:       General: She is not in acute distress.    Appearance: Normal appearance. She is well-developed and normal weight.  HENT:     Head: Normocephalic and atraumatic.     Right Ear: Tympanic membrane normal.     Left Ear: Tympanic membrane and ear canal normal.     Nose: No rhinorrhea.     Mouth/Throat:     Mouth: Mucous membranes are moist.     Pharynx: No posterior oropharyngeal erythema.  Eyes:     General:        Right eye: No discharge.        Left eye: No discharge.     Conjunctiva/sclera: Conjunctivae normal.     Pupils: Pupils are equal, round, and reactive to light.  Cardiovascular:     Rate and Rhythm: Normal rate and regular rhythm.     Heart sounds: Normal heart sounds.  Pulmonary:     Effort: Pulmonary effort is normal. No respiratory distress.     Breath sounds: Normal breath sounds. No wheezing or rhonchi.  Abdominal:     General: Abdomen is flat. There is no distension.     Palpations: Abdomen is soft.     Tenderness: There is no abdominal tenderness.     Comments: No organomegaly  Musculoskeletal:        General: Swelling and tenderness present. No deformity or signs of injury. Normal range of motion.       Arms:     Cervical back: Normal range of motion and neck supple.     Right lower leg: No edema.     Left lower leg: No edema.     Comments: Patient has swelling its visible on the dorsum of both hands from the third and CP to the thumb.  On the palmar aspect it is clear she has redness and synovitis in the MP joints of her index and long fingers both hands.  Twice she has swelling and synovitis of her right wrist but not her left over the carpal region.  Pain with any movement.  No other joints involved.  Skin:    General: Skin is warm and dry.     Findings: No rash.  Neurological:     Mental Status: She is alert.      UC Treatments / Results  Labs (all labs ordered are listed, but only abnormal results are displayed) Labs Reviewed  COMPREHENSIVE  METABOLIC PANEL  CBC WITH DIFFERENTIAL/PLATELET  LYME DISEASE SEROLOGY W/REFLEX  RHEUMATOID FACTOR  SEDIMENTATION RATE  POCT RAPID STREP A (OFFICE)    EKG   Radiology No results found.  Procedures Procedures (including critical care time)  Medications Ordered in UC Medications - No data to display  Initial Impression / Assessment and Plan / UC Course  I have reviewed the triage vital signs and the nursing notes.  Pertinent labs & imaging results that were available during my care of the patient were reviewed by me and considered in my medical decision making (see chart for details).     With polyarthralgia.  Diagnosis could be from a fever, bacterial infection, inflammatory disease most likely.  Will do some blood work and have her follow-up with her PCP next week.  Will contact PCP to expect patient's call Final Clinical Impressions(s) / UC Diagnoses   Final diagnoses:  Polyarthralgia  Fever in other diseases     Discharge Instructions      Make sure you are drinking lots of fluids Limit use of painful joints Take prednisone as directed Call your primary care doctor tomorrow to set up a follow-up appointment Hold meloxicam until needed.  Do not take last azithromycin.  I do not think it is needed   ED Prescriptions     Medication Sig Dispense Auth. Provider   predniSONE (DELTASONE) 20 MG tablet Take 2 a day for 5 days then 1 a day for 5 days then discontinue 15 tablet Delton See Letta Pate, MD      PDMP not reviewed this encounter.   Eustace Moore, MD 08/08/23 [phone]

## 2023-08-08 NOTE — Discharge Instructions (Addendum)
Make sure you are drinking lots of fluids Limit use of painful joints Take prednisone as directed Call your primary care doctor tomorrow to set up a follow-up appointment Hold meloxicam until needed.  Do not take last azithromycin.  I do not think it is needed

## 2023-08-08 NOTE — ED Triage Notes (Signed)
Bil hand swelling and pain x 4 days

## 2023-08-09 LAB — CBC WITH DIFFERENTIAL/PLATELET
Basophils Absolute: 0.1 10*3/uL (ref 0.0–0.2)
Basos: 1 %
EOS (ABSOLUTE): 0.1 10*3/uL (ref 0.0–0.4)
Eos: 1 %
Hematocrit: 37.4 % (ref 34.0–46.6)
Hemoglobin: 12 g/dL (ref 11.1–15.9)
Immature Grans (Abs): 0 10*3/uL (ref 0.0–0.1)
Immature Granulocytes: 0 %
Lymphocytes Absolute: 2.7 10*3/uL (ref 0.7–3.1)
Lymphs: 41 %
MCH: 29.7 pg (ref 26.6–33.0)
MCHC: 32.1 g/dL (ref 31.5–35.7)
MCV: 93 fL (ref 79–97)
Monocytes Absolute: 0.8 10*3/uL (ref 0.1–0.9)
Monocytes: 12 %
Neutrophils Absolute: 2.9 10*3/uL (ref 1.4–7.0)
Neutrophils: 45 %
Platelets: 258 10*3/uL (ref 150–450)
RBC: 4.04 x10E6/uL (ref 3.77–5.28)
RDW: 12 % (ref 11.7–15.4)
WBC: 6.5 10*3/uL (ref 3.4–10.8)

## 2023-08-09 LAB — RHEUMATOID FACTOR: Rheumatoid fact SerPl-aCnc: <10 [IU]/mL (ref <14.0)

## 2023-08-09 LAB — LYME DISEASE SEROLOGY W/REFLEX: Lyme Total Antibody EIA: NEGATIVE

## 2023-08-10 LAB — SPECIMEN STATUS REPORT

## 2023-08-10 NOTE — Progress Notes (Unsigned)
[facility] at [facility] [address] [place], [place] [address] [phone]  Date:  08/11/2023   Name:  Jessica Potter   DOB:  Dec 25, 1995   MRN:  [id number]  PCP:  Pearline Cables, MD    Chief Complaint: UC follow up (08/08/23: Polyarthralgia- Pt was tx with Prednisone. Pt has noticed improvement. /No new Concerns/ questions)   History of Present Illness:  Jessica Potter is a 27 y.o. 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 Delton See who saw her noted visible and palpable swelling on the dorsum of both hands and synovitis in the MP joints Patient was treated with prednisone and had lab evaluation including CBC with differential, rheumatoid factor, Lyme antibody which were all normal.  She also had a negative rapid strep.  Per notes a sedimentation rate was drawn by do not see this result in her chart Patient notes her hand swelling resolved within a day of starting on prednisone.  Fevers also resolved.  She never felt ill, no aches or myalgias.  She has never had anything like this happen to her before, she is not aware of any family history of autoimmune disease.  She does not recall if she has used a Z-Pak in the past or not    Patient Active Problem List   Diagnosis Date Noted   Oral allergy syndrome, subsequent encounter 03/12/2021   Precordial pain 10/03/2020   Anomalous coronary artery  origin 10/03/2020   Eczema    Vaginal Pap smear, abnormal 2020   Flexural eczema 02/01/2017   Seasonal and perennial allergic rhinoconjunctivitis 04/14/2016    Past Medical History:  Diagnosis Date   Eczema    Flexural eczema 02/01/2017   Seasonal allergic rhinitis 04/14/2016   Vaginal Pap smear, abnormal 2020   LGSIL    Past Surgical History:  Procedure Laterality Date   NO PAST SURGERIES     WISDOM TOOTH EXTRACTION Bilateral 01/15/2022    Social History   Tobacco Use   Smoking status: Never   Smokeless tobacco: Never  Vaping Use   Vaping status: Never Used  Substance Use Topics   Alcohol use: No   Drug use: No    Family History  Problem Relation Age of Onset   Diabetes Maternal Grandmother     Allergies  Allergen Reactions   No Known Allergies     Medication list has been reviewed and updated.  Current Outpatient Medications on File Prior to Visit  Medication Sig Dispense Refill   EPINEPHrine (EPIPEN 2-PAK) 0.3 mg/0.3 mL IJ SOAJ injection Inject 0.3 mg into the muscle as needed for anaphylaxis. 2 each PRN   fexofenadine (ALLEGRA) 180 MG tablet Take 1 tablet (180 mg total) by mouth daily. 90 tablet 3   meloxicam (MOBIC) 15 MG tablet Take 1 tablet (15 mg total) by mouth daily. Take as needed for shoulder pain 30 tablet 0  predniSONE (DELTASONE) 20 MG tablet Take 2 a day for 5 days then 1 a day for 5 days then discontinue 15 tablet 0   [DISCONTINUED] metoprolol tartrate (LOPRESSOR) 100 MG tablet Take 1 tablet (100 mg total) by mouth once for 1 dose. Take 2 hours prior to your CT if your heart rate is greater than 55 1 tablet 0   No current facility-administered medications on file prior to visit.    Review of Systems:  As per HPI- otherwise negative.   Physical Examination: Vitals:   08/11/23 1058 08/11/23 1121  BP: 110/70   Pulse: (!) 107 90  Resp: 18   Temp: 98 F (36.7 C)   SpO2: 99%    Vitals:   08/11/23 1058  Weight: 120 lb 3.2 oz (54.5 kg)   Height: 5\' 3"  (1.6 m)   Body mass index is 21.29 kg/m. Ideal Body Weight: Weight in (lb) to have BMI = 25: 140.8  GEN: no acute distress.  Normal weight, looks well HEENT: Atraumatic, Normocephalic.  Ears and Nose: No external deformity. CV: RRR, No M/G/R. No JVD. No thrill. No extra heart sounds. PULM: CTA B, no wheezes, crackles, rhonchi. No retractions. No resp. distress. No accessory muscle use. ABD: S, NT, ND, +BS. No rebound. No HSM. EXTR: No c/c/e PSYCH: Normally interactive. Conversant.  Swelling and tenderness in both hands has entirely resolved.  Normal exam bilaterally  Pulse Readings from Last 3 Encounters:  08/11/23 90  08/08/23 84  08/04/23 96    Assessment and Plan: Polyarteritis (HCC) - Plan: Sedimentation rate, C-reactive protein, Cyclic citrul peptide antibody, IgG ([facility])  Patient seen today with recent episode of polyarteritis and low-grade fever.  She is now feeling back to normal, symptoms are entirely resolved.  Will run some additional blood work as above in an attempt to determine cause of her symptoms.  If any significant abnormality we will look further.  Otherwise, she is asked to please let me know if this type of problem should come back  Signed Abbe Amsterdam, MD  Received labs as below, message to patient  Results for orders placed or performed in visit on 08/11/23  Sedimentation rate  Result Value Ref Range   Sed Rate 18 0 - 20 mm/hr  C-reactive protein  Result Value Ref Range   CRP <1.0 0.5 - 20.0 mg/dL

## 2023-08-11 ENCOUNTER — Ambulatory Visit (INDEPENDENT_AMBULATORY_CARE_PROVIDER_SITE_OTHER): Payer: 59 | Admitting: Family Medicine

## 2023-08-11 ENCOUNTER — Encounter: Payer: Self-pay | Admitting: Family Medicine

## 2023-08-11 VITALS — BP 110/70 | HR 90 | Temp 98.0°F | Resp 18 | Ht 63.0 in | Wt 120.2 lb

## 2023-08-11 DIAGNOSIS — M300 Polyarteritis nodosa: Secondary | ICD-10-CM | POA: Diagnosis not present

## 2023-08-11 LAB — C-REACTIVE PROTEIN: CRP: <1 mg/dL (ref 0.5–20.0)

## 2023-08-11 LAB — SEDIMENTATION RATE: Sed Rate: 18 mm/h (ref 0–20)

## 2023-08-12 LAB — SEDIMENTATION RATE

## 2023-08-13 LAB — COMPREHENSIVE METABOLIC PANEL
ALT: 18 [IU]/L (ref 0–32)
AST: 20 [IU]/L (ref 0–40)
Albumin: 4.1 g/dL (ref 4.0–5.0)
Alkaline Phosphatase: 78 [IU]/L (ref 44–121)
BUN/Creatinine Ratio: 18 (ref 9–23)
BUN: 12 mg/dL (ref 6–20)
Bilirubin Total: <0.2 mg/dL (ref 0.0–1.2)
CO2: 20 mmol/L (ref 20–29)
Calcium: 8.8 mg/dL (ref 8.7–10.2)
Chloride: 106 mmol/L (ref 96–106)
Creatinine, Ser: 0.67 mg/dL (ref 0.57–1.00)
Globulin, Total: 2.8 g/dL (ref 1.5–4.5)
Glucose: 105 mg/dL — ABNORMAL HIGH (ref 70–99)
Potassium: 3.9 mmol/L (ref 3.5–5.2)
Sodium: 145 mmol/L — ABNORMAL HIGH (ref 134–144)
Total Protein: 6.9 g/dL (ref 6.0–8.5)
eGFR: 124 mL/min/{1.73_m2} (ref >59)

## 2023-08-14 LAB — CYCLIC CITRUL PEPTIDE ANTIBODY, IGG: Cyclic Citrullin Peptide Ab: <16 U

## 2023-08-15 ENCOUNTER — Encounter: Payer: Self-pay | Admitting: Family Medicine

## 2024-01-20 ENCOUNTER — Encounter

## 2024-01-31 ENCOUNTER — Encounter

## 2024-02-01 ENCOUNTER — Ambulatory Visit

## 2024-02-01 VITALS — BP 115/63 | HR 105 | Wt 119.1 lb

## 2024-02-01 DIAGNOSIS — Z3A10 10 weeks gestation of pregnancy: Secondary | ICD-10-CM

## 2024-02-01 DIAGNOSIS — Z3201 Encounter for pregnancy test, result positive: Secondary | ICD-10-CM

## 2024-02-01 DIAGNOSIS — Z3400 Encounter for supervision of normal first pregnancy, unspecified trimester: Secondary | ICD-10-CM | POA: Insufficient documentation

## 2024-02-01 DIAGNOSIS — Z3401 Encounter for supervision of normal first pregnancy, first trimester: Secondary | ICD-10-CM | POA: Diagnosis not present

## 2024-02-01 NOTE — Progress Notes (Signed)
 New OB Intake  I connected with Jessica Potter  on 02/01/24 at  3:00 PM EDT by office Visit and verified that I am speaking with the correct person using two identifiers. Nurse is located at [facility] and pt is located at [address] [place], [place] [address]..  I discussed the limitations, risks, security and privacy concerns of performing an evaluation and management service by telephone and the availability of in person appointments. I also discussed with the patient that there may be a patient responsible charge related to this service. The patient expressed understanding and agreed to proceed.  I explained I am completing New OB Intake today. We discussed EDD of Not found.. Pt is [id number]. I reviewed her allergies, medications and Medical/Surgical/OB history.    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 Discussed the nature of our practice with multiple providers including residents and students. Due to the size of the practice, the delivering provider may not be the same as those providing prenatal care.   Patient is not interested in water birth.  MyChart/Babyscripts MyChart access verified. I explained pt will have some visits in office and some virtually. Babyscripts instructions given and order placed. Patient verifies receipt of registration text/e-mail. Account successfully created and app downloaded. If patient is a candidate for Optimized scheduling, add to sticky note.    Anatomy US  Explained first scheduled US  will be around 19 weeks. Anatomy US  will be scheduled at NOB appointments.  Is patient a  CenteringPregnancy candidate?  Accepted    Is patient a Mom+Baby Combined Care candidate?  Declined   If accepted, confirm patient does not intend to move from the area for at least 12 months, then notify Mom+Baby staff  Is patient a candidate for Babyscripts Optimization? Yes, patient accepted    First visit review I reviewed new OB appt with patient. Explained pt will be seen by [name] at first visit. Discussed [facility] genetic screening with patient.  [facility] Routine prenatal labs were collected at this visit.   Last Pap Diagnosis  Date Value Ref Range Status  03/11/2022 - Low grade squamous intraepithelial lesion (LSIL) (A)  Final    Jessica Potter l Jessica Potter, CMA 02/01/2024  2:47 PM

## 2024-02-02 LAB — CBC/D/PLT+RPR+RH+ABO+RUBIGG...
Antibody Screen: NEGATIVE
Basophils Absolute: 0.1 10*3/uL (ref 0.0–0.2)
Basos: 1 %
EOS (ABSOLUTE): 0.3 10*3/uL (ref 0.0–0.4)
Eos: 4 %
HCV Ab: NONREACTIVE
HIV Screen 4th Generation wRfx: NONREACTIVE
Hematocrit: 40.7 % (ref 34.0–46.6)
Hemoglobin: 13.3 g/dL (ref 11.1–15.9)
Hepatitis B Surface Ag: NEGATIVE
Immature Grans (Abs): 0 10*3/uL (ref 0.0–0.1)
Immature Granulocytes: 0 %
Lymphocytes Absolute: 1.7 10*3/uL (ref 0.7–3.1)
Lymphs: 23 %
MCH: 30.6 pg (ref 26.6–33.0)
MCHC: 32.7 g/dL (ref 31.5–35.7)
MCV: 94 fL (ref 79–97)
Monocytes Absolute: 0.6 10*3/uL (ref 0.1–0.9)
Monocytes: 9 %
Neutrophils Absolute: 4.6 10*3/uL (ref 1.4–7.0)
Neutrophils: 63 %
Platelets: 318 10*3/uL (ref 150–450)
RBC: 4.34 x10E6/uL (ref 3.77–5.28)
RDW: 12.6 % (ref 11.7–15.4)
RPR Ser Ql: NONREACTIVE
Rh Factor: POSITIVE
Rubella Antibodies, IGG: 3.8 {index} (ref >0.99)
WBC: 7.2 10*3/uL (ref 3.4–10.8)

## 2024-02-02 LAB — HCV INTERPRETATION

## 2024-02-03 LAB — URINE CULTURE, OB REFLEX

## 2024-02-03 LAB — CULTURE, OB URINE

## 2024-02-04 LAB — POCT URINE PREGNANCY: Preg Test, Ur: POSITIVE — AB

## 2024-02-15 ENCOUNTER — Other Ambulatory Visit (HOSPITAL_COMMUNITY)
Admission: RE | Admit: 2024-02-15 | Discharge: 2024-02-15 | Disposition: A | Discharge status: Home / Self Care | Source: Ambulatory Visit

## 2024-02-15 ENCOUNTER — Ambulatory Visit (INDEPENDENT_AMBULATORY_CARE_PROVIDER_SITE_OTHER)

## 2024-02-15 VITALS — BP 104/58 | HR 81 | Wt 121.0 lb

## 2024-02-15 DIAGNOSIS — Z3A12 12 weeks gestation of pregnancy: Secondary | ICD-10-CM | POA: Diagnosis not present

## 2024-02-15 DIAGNOSIS — Z8742 Personal history of other diseases of the female genital tract: Secondary | ICD-10-CM | POA: Diagnosis not present

## 2024-02-15 DIAGNOSIS — Z9889 Other specified postprocedural states: Secondary | ICD-10-CM | POA: Insufficient documentation

## 2024-02-15 DIAGNOSIS — Z3401 Encounter for supervision of normal first pregnancy, first trimester: Secondary | ICD-10-CM | POA: Diagnosis not present

## 2024-02-15 DIAGNOSIS — Z3400 Encounter for supervision of normal first pregnancy, unspecified trimester: Secondary | ICD-10-CM | POA: Insufficient documentation

## 2024-02-15 NOTE — Progress Notes (Signed)
 Subjective:   Jessica Potter is a 28 y.o. G1P0000 at [redacted]w[redacted]d by Definite  LMP of November 20, 2023 being seen today for her first obstetrical visit.  Patient states this was an unplanned pregnancy.  Patient reports she was not on birth control prior to conception. However, she has taken depo in the past that she was satisfied.   Gynecological/Obstetrical History: Patient reports history of gynecological surgeries-LEEP Jan 2024.  Patient endorses history of abnormal pap smears.   Pregnancy history fully reviewed. Patient does intend to breast and bottlefeed. Patient obstetrical history is unremarkable.   Sexual Activity and Vaginal Concerns: Patient is currently sexually active and dnies pain or discomfort during intercourse.  She also denies vaginal discharge, bleeding, irritation, or odor. Patient also denies pain or difficulty with urination.    Medical History/ROS: Patient denies medical history significant for cardiovascular, respiratory, gastrointestinal, or hematological disorders. Patient also denies history of MH disorders including anxiety and/or depression.  Patient reports nausea and itchy nipples.  She reports she has been applying coconut oil and Vaseline with occasional relief. She reports lemons and sunflower seeds have been supportive in reducing nausea.  Patient denies constipation/diarrhea.  No recurrent headaches.    Social History: Patient denies history or current usage of tobacco, vaping, alcohol, or drugs.  Patient reports the FOB is Thurston Flow who is supportive and present.  Patient reports that she lives with grandmother and sister and endorses safety at home.  Patient denies DV/A. 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  HISTORY: OB History  Gravida Para Term Preterm AB Living  1 0 0 0 0 0  SAB IAB Ectopic Multiple Live Births  0 0 0 0 0    # Outcome Date GA Lbr Len/2nd Weight Sex Type Anes PTL Lv  1 Current             Last pap smear  was done June 2023 and was abnormal - LGSIL, No HPV-Colpo and LEEP  Past Medical History:  Diagnosis Date   Eczema    Flexural eczema 02/01/2017   Seasonal allergic rhinitis 04/14/2016   Vaginal Pap smear, abnormal 2020   LGSIL   Past Surgical History:  Procedure Laterality Date   NO PAST SURGERIES     WISDOM TOOTH EXTRACTION Bilateral 01/15/2022   Family History  Problem Relation Age of Onset   Diabetes Maternal Grandmother    Social History   Tobacco Use   Smoking status: Never   Smokeless tobacco: Never  Vaping Use   Vaping status: Never Used  Substance Use Topics   Alcohol use: No   Drug use: No   Allergies  Allergen Reactions   No Known Allergies    Current Outpatient Medications on File Prior to Visit  Medication Sig Dispense Refill   Prenatal Vit-Fe Fumarate-FA (MULTIVITAMIN-PRENATAL) 27-0.8 MG TABS tablet Take 1 tablet by mouth daily at 12 noon.     EPINEPHrine  (EPIPEN  2-PAK) 0.3 mg/0.3 mL IJ SOAJ injection Inject 0.3 mg into the muscle as needed for anaphylaxis. (Patient not taking: Reported on 02/15/2024) 2 each PRN   fexofenadine  (ALLEGRA ) 180 MG tablet Take 1 tablet (180 mg total) by mouth daily. (Patient not taking: Reported on 02/15/2024) 90 tablet 3   meloxicam  (MOBIC ) 15 MG tablet Take 1 tablet (15 mg total) by mouth daily. Take as needed for shoulder pain (Patient not taking: Reported on 02/15/2024) 30 tablet 0   predniSONE  (DELTASONE ) 20 MG tablet Take 2 a day  for 5 days then 1 a day for 5 days then discontinue (Patient not taking: Reported on 02/15/2024) 15 tablet 0   [DISCONTINUED] metoprolol  tartrate (LOPRESSOR ) 100 MG tablet Take 1 tablet (100 mg total) by mouth once for 1 dose. Take 2 hours prior to your CT if your heart rate is greater than 55 1 tablet 0   No current facility-administered medications on file prior to visit.    Review of Systems Pertinent items noted in HPI and remainder of comprehensive ROS otherwise negative.  Exam   Vitals:    02/15/24 1328  BP: (!) 104/58  Pulse: 81  Weight: 121 lb 0.6 oz (54.9 kg)   Fetal Heart Rate (bpm): 161  Physical Exam Constitutional:      Appearance: Normal appearance.  Genitourinary:     Genitourinary Comments: NEFG Pap collected with broom and spatula.   BME completed     No vaginal discharge or tenderness.     Cervical friability present.     No cervical discharge, polyp or nabothian cyst.     Uterus is enlarged (C/w 12-[redacted] wk GA).     Uterus is not tender.     Uterus is anteverted.  HENT:     Head: Normocephalic and atraumatic.  Eyes:     Conjunctiva/sclera: Conjunctivae normal.  Cardiovascular:     Rate and Rhythm: Normal rate.  Pulmonary:     Effort: Pulmonary effort is normal. No respiratory distress.     Breath sounds: Normal breath sounds.  Abdominal:     General: Bowel sounds are normal.     Palpations: Abdomen is soft.     Tenderness: There is no abdominal tenderness.     Comments: Fundus above pubic bone  Musculoskeletal:     Cervical back: Normal range of motion.  Neurological:     Mental Status: She is alert and oriented to person, place, and time.  Skin:    General: Skin is warm and dry.  Psychiatric:        Mood and Affect: Mood normal.        Behavior: Behavior normal.  Vitals reviewed. Exam conducted with a chaperone present ([place], [place] & [place], [place]).     Assessment:   28 y.o. 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 Supervision of normal first pregnancy, antepartum -Congratulations given and patient welcomed to practice. -Discussed potential  usage of Babyscripts and virtual visits as additional source of managing and completing PN visits.   -Reviewed prenatal visit schedule and platforms used for virtual visits.  -Anticipatory guidance for interventions during prenatal visits including labs, ultrasounds, and testing; Initial labs reviewed. -Genetic Screening discussed, First trimester screen, Quad screen, and NIPS: ordered. -Ultrasound discussed; fetal anatomic survey: ordered. -Discussed estimated due date of Aug 26, 2024. -Continue prenatal vitamins  -Encouraged to seek out care at office or emergency room for urgent and/or emergent concerns. -Educated on the nature of [facility] [facility] with multiple MDs and other Advanced Practice Providers was explained to patient; also emphasized that residents, students are part of our team. Informed of  her right to refuse care as she deems appropriate.  -Encouraged to complete and utilize MyChart Registration for her ability to review results, send requests, and have questions addressed.  -No questions or concerns.    2. [redacted] weeks gestation of pregnancy -Doing well.  -Reviewed low blood pressure and precautions given.   3. History of abnormal cervical Pap smear -Pap collected today with cotesting.   4. H/O LEEP -Plan for cervical length with anatomy US        Problem list reviewed and updated. Routine obstetric precautions reviewed.  Orders Placed This Encounter  Procedures   HORIZON Basic Panel    ==========Department Information========== ID: [id number] Department:[facility] [place] [address] [place] [address] Dept: [phone] Dept Fax: [phone]     Specify the name or ID of a valid Horizon Custom Panel::   H BASIC    Is patient pregnant?:   Yes    Ethnicity of patient::   African American    Patient authorizes Linard Reno to share  patient's Horizon test results with partner and their medical provider?:   No    Practice ensures that HIPAA consent is obtained and will make available to [place] upon request?:   Yes    By placing this electronic order I confirm the testing ordered herein is medically necessary and this patient has been informed of the details of the genetic test(s) ordered, including the risks, benefits, and alternatives, and has consented to testing.:   Yes    What type of billing?:   [employer] an order diagnosis: For additional options refer to [email or web address].[email or web address]:   Supervision of normal first pregnancy [V22.0.ICD-9-CM]    Tay-Sachs add-on test?:   No    Release to patient:   Immediate [1]   PANORAMA PRENATAL TEST    ==========Department Information========== ID: [id number] Department:[facility] [place] [address] [place] [address] Dept: [phone] Dept Fax: [phone]     Method/type of collection::   Clinic to manage sample collection    Expected due date (MM/DD/YYYY)::   08/26/2024    Is this a twin pregnancy? (viable, no vanished twin):   Not Twin Pregnancy, Willeen Harold    Is this a surrogate or egg donor pregnancy?:   No    I want fetal sex included in the report::   Yes    For RhD (-) patients only, do you want fetal RhD reported?:   Yes    Maternal Weight (lbs)::   121    Which Microdeletion Panel should be ordered?:   None    Enroll this patient in the Automatic Redraw Program?:   Yes    What type of billing?:   Automatic Data    By placing this electronic order I confirm the testing ordered herein is medically necessary and this patient has been informed of the details of the genetic test(s) ordered, including the risks, benefits, and alternatives, and has consented to testing.:   Yes    Select an order diagnosis: For additional options refer to  [email or web address]:   Supervision of normal first pregnancy, antepartum [0160109]    CC Results:   Linas Stepter [1003901]    Release to patient:   Immediate [  1]    No follow-ups on file.     Kraig Peru, CNM 02/15/2024 1:40 PM

## 2024-02-16 ENCOUNTER — Other Ambulatory Visit (INDEPENDENT_AMBULATORY_CARE_PROVIDER_SITE_OTHER)

## 2024-02-16 ENCOUNTER — Other Ambulatory Visit: Payer: Self-pay

## 2024-02-16 ENCOUNTER — Encounter: Payer: Self-pay | Admitting: Family Medicine

## 2024-02-16 DIAGNOSIS — Z3A10 10 weeks gestation of pregnancy: Secondary | ICD-10-CM

## 2024-02-16 DIAGNOSIS — Z3400 Encounter for supervision of normal first pregnancy, unspecified trimester: Secondary | ICD-10-CM

## 2024-02-16 DIAGNOSIS — Z3A12 12 weeks gestation of pregnancy: Secondary | ICD-10-CM

## 2024-02-16 DIAGNOSIS — Z3401 Encounter for supervision of normal first pregnancy, first trimester: Secondary | ICD-10-CM

## 2024-02-16 LAB — COMPREHENSIVE METABOLIC PANEL WITH GFR
ALT: 16 IU/L (ref 0–32)
AST: 17 IU/L (ref 0–40)
Albumin: 4.2 g/dL (ref 4.0–5.0)
Alkaline Phosphatase: 50 IU/L (ref 44–121)
BUN/Creatinine Ratio: 11 (ref 9–23)
BUN: 7 mg/dL (ref 6–20)
Bilirubin Total: 0.2 mg/dL (ref 0.0–1.2)
CO2: 20 mmol/L (ref 20–29)
Calcium: 9 mg/dL (ref 8.7–10.2)
Chloride: 103 mmol/L (ref 96–106)
Creatinine, Ser: 0.64 mg/dL (ref 0.57–1.00)
Globulin, Total: 2.4 g/dL (ref 1.5–4.5)
Glucose: 74 mg/dL (ref 70–99)
Potassium: 4.1 mmol/L (ref 3.5–5.2)
Sodium: 136 mmol/L (ref 134–144)
Total Protein: 6.6 g/dL (ref 6.0–8.5)
eGFR: 125 mL/min/{1.73_m2} (ref >59)

## 2024-02-16 LAB — HEMOGLOBIN A1C
Est. average glucose Bld gHb Est-mCnc: 97 mg/dL
Hgb A1c MFr Bld: 5 % (ref 4.8–5.6)

## 2024-02-18 LAB — CYTOLOGY - PAP
Chlamydia: NEGATIVE
Comment: NEGATIVE
Comment: NEGATIVE
Comment: NEGATIVE
Comment: NEGATIVE
Comment: NORMAL
HPV 16: NEGATIVE
HPV 18 / 45: NEGATIVE
High risk HPV: POSITIVE — AB
Neisseria Gonorrhea: NEGATIVE

## 2024-02-19 ENCOUNTER — Ambulatory Visit: Payer: Self-pay

## 2024-02-20 LAB — PANORAMA PRENATAL TEST FULL PANEL:PANORAMA TEST PLUS 5 ADDITIONAL MICRODELETIONS: FETAL FRACTION: 12.1

## 2024-02-21 NOTE — Telephone Encounter (Signed)
 Patient made aware of pap results and need for Colposcopy.  To be scheduled after 20 weeks.  Note placed in pregnancy sticky note to schedule.  Jessica Potter

## 2024-02-21 NOTE — Telephone Encounter (Signed)
-----   Message from Kraig Peru sent at 02/19/2024  8:27 AM EDT ----- Please call patient and address any questions she may have regarding colposcopy.  Schedule when she is over 20 weeks.  Thanks, Camilo Cella

## 2024-02-22 LAB — HORIZON CUSTOM: REPORT SUMMARY: NEGATIVE

## 2024-03-14 ENCOUNTER — Ambulatory Visit (INDEPENDENT_AMBULATORY_CARE_PROVIDER_SITE_OTHER): Admitting: Obstetrics and Gynecology

## 2024-03-14 VITALS — BP 100/61 | HR 93 | Wt 119.0 lb

## 2024-03-14 DIAGNOSIS — R87619 Unspecified abnormal cytological findings in specimens from cervix uteri: Secondary | ICD-10-CM

## 2024-03-14 DIAGNOSIS — Z3A16 16 weeks gestation of pregnancy: Secondary | ICD-10-CM | POA: Diagnosis not present

## 2024-03-14 DIAGNOSIS — Z9889 Other specified postprocedural states: Secondary | ICD-10-CM

## 2024-03-14 DIAGNOSIS — Z3400 Encounter for supervision of normal first pregnancy, unspecified trimester: Secondary | ICD-10-CM

## 2024-03-14 DIAGNOSIS — Z3401 Encounter for supervision of normal first pregnancy, first trimester: Secondary | ICD-10-CM | POA: Diagnosis not present

## 2024-03-14 NOTE — Progress Notes (Signed)
   PRENATAL VISIT NOTE  Subjective:  Jessica Potter is a 28 y.o. G1P0000 at [redacted]w[redacted]d being seen today for ongoing prenatal care.  She is currently monitored for the following issues for this low-risk pregnancy and has Flexural eczema; Eczema; Precordial pain; Anomalous coronary artery origin; Oral allergy syndrome, subsequent encounter; Seasonal and perennial allergic rhinoconjunctivitis; Supervision of normal first pregnancy, antepartum; and H/O LEEP on their problem list.  Patient reports no complaints.  Contractions: Not present. Vag. Bleeding: None.  Movement: Absent. Denies leaking of fluid.   The following portions of the patient's history were reviewed and updated as appropriate: allergies, current medications, past family history, past medical history, past social history, past surgical history and problem list.   Objective:    Vitals:   03/14/24 1402  BP: 100/61  Pulse: 93  Weight: 119 lb (54 kg)    Fetal Status:  Fetal Heart Rate (bpm): 156   Movement: Absent    General: Alert, oriented and cooperative. Patient is in no acute distress.  Skin: Skin is warm and dry. No rash noted.   Cardiovascular: Normal heart rate noted  Respiratory: Normal respiratory effort, no problems with respiration noted  Abdomen: Soft, gravid, appropriate for gestational age.  Pain/Pressure: Absent     Pelvic: Cervical exam deferred        Extremities: Normal range of motion.  Edema: None  Mental Status: Normal mood and affect. Normal behavior. Normal judgment and thought content.   Assessment and Plan:  Pregnancy: G1P0000 at [redacted]w[redacted]d 1. Encounter for supervision of normal first pregnancy in first trimester (Primary) BP and FHR normal Doing well overall   2. [redacted] weeks gestation of pregnancy AFP today   3. Abnormal cervical Papanicolaou smear, unspecified abnormal pap finding LSIL HPV pos, planning colpo after 20 weeks  4. H/O LEEP   Preterm labor symptoms and general obstetric precautions  including but not limited to vaginal bleeding, contractions, leaking of fluid and fetal movement were reviewed in detail with the patient. Please refer to After Visit Summary for other counseling recommendations.       Nidia Daring, FNP

## 2024-03-18 ENCOUNTER — Ambulatory Visit: Payer: Self-pay | Admitting: Obstetrics and Gynecology

## 2024-03-22 LAB — AFP, SERUM, OPEN SPINA BIFIDA
AFP MoM: 0.9
AFP Value: 38.2 ng/mL
Gest. Age on Collection Date: 16 wk
Maternal Age At EDD: 26.9 a
OSBR Risk 1 IN: 10000
Weight: 119 [lb_av]

## 2024-04-10 ENCOUNTER — Ambulatory Visit (INDEPENDENT_AMBULATORY_CARE_PROVIDER_SITE_OTHER): Admitting: Obstetrics & Gynecology

## 2024-04-10 ENCOUNTER — Other Ambulatory Visit (HOSPITAL_BASED_OUTPATIENT_CLINIC_OR_DEPARTMENT_OTHER): Payer: Self-pay

## 2024-04-10 VITALS — BP 112/61 | HR 101 | Wt 120.0 lb

## 2024-04-10 DIAGNOSIS — Z3A20 20 weeks gestation of pregnancy: Secondary | ICD-10-CM | POA: Diagnosis not present

## 2024-04-10 DIAGNOSIS — Z3400 Encounter for supervision of normal first pregnancy, unspecified trimester: Secondary | ICD-10-CM

## 2024-04-10 MED ORDER — ASPIRIN 81 MG PO TBEC
81.0000 mg | DELAYED_RELEASE_TABLET | Freq: Every day | ORAL | 2 refills | Status: AC
  Filled 2024-04-10: qty 120, 120d supply, fill #0

## 2024-04-10 NOTE — Progress Notes (Signed)
   PRENATAL VISIT NOTE  Subjective:  Jessica Potter is a 28 y.o. G1P0000 at [redacted]w[redacted]d being seen today for ongoing prenatal care.  She is currently monitored for the following issues for this low-risk pregnancy and has Eczema; Oral allergy syndrome, subsequent encounter; Seasonal and perennial allergic rhinoconjunctivitis; Supervision of normal first pregnancy, antepartum; and H/O LEEP on their problem list.  Patient reports no complaints.  Contractions: Not present. Vag. Bleeding: None.  Movement: Present. Denies leaking of fluid. Accompanied by her mother.  The following portions of the patient's history were reviewed and updated as appropriate: allergies, current medications, past family history, past medical history, past social history, past surgical history and problem list.   Objective:    Vitals:   04/10/24 1400  BP: 112/61  Pulse: (!) 101  Weight: 120 lb (54.4 kg)    Fetal Status:  Fetal Heart Rate (bpm): 147 Fundal Height: 20 cm Movement: Present    General: Alert, oriented and cooperative. Patient is in no acute distress.  Skin: Skin is warm and dry. No rash noted.   Cardiovascular: Normal heart rate noted  Respiratory: Normal respiratory effort, no problems with respiration noted  Abdomen: Soft, gravid, appropriate for gestational age.  Pain/Pressure: Absent     Pelvic: Cervical exam deferred        Extremities: Normal range of motion.  Edema: None  Mental Status: Normal mood and affect. Normal behavior. Normal judgment and thought content.   Assessment and Plan:  Pregnancy: G1P0000 at [redacted]w[redacted]d 1. [redacted] weeks gestation of pregnancy 2. Supervision of normal first pregnancy, antepartum (Primary) Mother reports she had preeclampsia during pregnancy with patient. Patient meets criteria for aspirin  preeclampsia prophylaxis, this was prescribed. - aspirin  EC 81 MG tablet; Take 1 tablet (81 mg total) by mouth at bedtime. Start taking when you are [redacted] weeks pregnant for rest of pregnancy  for prevention of preeclampsia  Dispense: 360 tablet; Refill: 2 Scheduled for anatomy scan soon. Preterm labor symptoms and general obstetric precautions including but not limited to vaginal bleeding, contractions, leaking of fluid and fetal movement were reviewed in detail with the patient. Please refer to After Visit Summary for other counseling recommendations.   Return in about 4 weeks (around 05/08/2024) for OFFICE OB VISIT (MD or APP).  Future Appointments  Date Time Provider Department Center  05/02/2024 10:00 AM [facility] PROVIDER 1 [facility]  05/02/2024 10:30 AM [facility] US1 [facility]  05/11/2024  2:10 PM Barbra Lang PARAS, DO [facility] None  06/05/2024  8:35 AM Eveline Lynwood MATSU, MD [facility] None    Gloris Hugger, MD

## 2024-04-24 ENCOUNTER — Other Ambulatory Visit (HOSPITAL_BASED_OUTPATIENT_CLINIC_OR_DEPARTMENT_OTHER): Payer: Self-pay

## 2024-05-02 ENCOUNTER — Other Ambulatory Visit (HOSPITAL_BASED_OUTPATIENT_CLINIC_OR_DEPARTMENT_OTHER)

## 2024-05-02 ENCOUNTER — Ambulatory Visit: Attending: Obstetrics and Gynecology | Admitting: Maternal & Fetal Medicine

## 2024-05-02 VITALS — BP 102/59 | HR 90

## 2024-05-02 DIAGNOSIS — Z363 Encounter for antenatal screening for malformations: Secondary | ICD-10-CM | POA: Diagnosis not present

## 2024-05-02 DIAGNOSIS — Z3A23 23 weeks gestation of pregnancy: Secondary | ICD-10-CM

## 2024-05-02 DIAGNOSIS — Z9889 Other specified postprocedural states: Secondary | ICD-10-CM | POA: Diagnosis not present

## 2024-05-02 DIAGNOSIS — Z3689 Encounter for other specified antenatal screening: Secondary | ICD-10-CM | POA: Insufficient documentation

## 2024-05-02 DIAGNOSIS — Z3400 Encounter for supervision of normal first pregnancy, unspecified trimester: Secondary | ICD-10-CM | POA: Diagnosis not present

## 2024-05-02 DIAGNOSIS — Z3401 Encounter for supervision of normal first pregnancy, first trimester: Secondary | ICD-10-CM

## 2024-05-02 DIAGNOSIS — Z3402 Encounter for supervision of normal first pregnancy, second trimester: Secondary | ICD-10-CM | POA: Diagnosis present

## 2024-05-02 NOTE — Progress Notes (Signed)
 None  Patient information  Patient Name: Jessica Potter  Patient MRN:   [id number]  Referring practice: MFM Referring Provider: [place] [facility] [place] ([facility])  Problem List   Patient Active Problem List   Diagnosis Date Noted   H/O LEEP 02/15/2024   Supervision of normal first pregnancy, antepartum 02/01/2024   Oral allergy syndrome, subsequent encounter 03/12/2021   Eczema    Seasonal and perennial allergic rhinoconjunctivitis 04/14/2016   Maternal Fetal Medicine Consult Jessica Potter is a 28 y.o. G1P0000 at [redacted]w[redacted]d here for ultrasound and consultation. She had Low risk aneuploidy screening of a female fetus. Carrier screening was Negative for the basic screening (SMA, alpha-thal, beta-thal, and cystic fibroisis. Maternal serum AFP was negative. She has no acute concerns.   Today we focused on the following:   History of LEEP: There is no indication for a transvaginal CL based on [facility] guidelines as long as the cervix is well visualized from above.  I discussed the risk factor for preterm birth is actually due to the presence of the HPV virus.  However, testing for this virus is not warranted given that she has already had a positive test for cervical pathology.   Sonographic findings Single intrauterine pregnancy at 23w 3d. Fetal cardiac activity:  Observed and appears normal. Presentation: Cephalic. The anatomic structures that were well seen appear normal without evidence of soft markers. The anatomic survey is complete.  Fetal biometry shows the estimated fetal weight at the 12 percentile. Amniotic fluid: Within normal limits.  MVP: 4.96 cm. Placenta: Posterior. Adnexa: No abnormality visualized. Cervical length: 4.1 cm.  There are limitations of prenatal ultrasound such as the inability to detect certain abnormalities due to poor visualization. Various factors such as fetal position, gestational age and maternal body habitus may increase the difficulty in visualizing the fetal  anatomy.    Recommendations -EDD should be 08/26/2024 based on  LMP  (11/20/23). -No further ultrasounds are recommended at this time based on the current indications. If future indications arise (e.g. size/date discrepancy on fundal height, gestational diabetes or hypertension) and an ultrasound is to be desired at our MFM office, please send a referral.   Review of Systems: A review of systems was performed and was negative except per HPI   Past Obstetrical History:  OB History  Gravida Para Term Preterm AB Living  1 0 0 0 0 0  SAB IAB Ectopic Multiple Live Births  0 0 0 0 0    # Outcome Date GA Lbr Len/2nd Weight Sex Type Anes PTL Lv  1 Current              Past Medical History:  Past Medical History:  Diagnosis Date   Eczema    Flexural eczema 02/01/2017   Seasonal allergic rhinitis 04/14/2016   Vaginal Pap smear, abnormal 2020   LGSIL     Past Surgical History:    Past Surgical History:  Procedure Laterality Date   CERVICAL BIOPSY  W/ LOOP ELECTRODE EXCISION  2024   WISDOM TOOTH EXTRACTION Bilateral 01/15/2022     Home Medications:   Current Outpatient Medications on File Prior to Visit  Medication Sig Dispense Refill   aspirin  EC 81 MG tablet Take 1 tablet (81 mg total) by mouth at bedtime. Start taking when you are [redacted] weeks pregnant for rest of pregnancy for prevention of preeclampsia 360 tablet 2   Prenatal Vit-Fe Fumarate-FA (MULTIVITAMIN-PRENATAL) 27-0.8 MG TABS tablet Take 1 tablet by mouth daily at 12 noon.  EPINEPHrine  (EPIPEN  2-PAK) 0.3 mg/0.3 mL IJ SOAJ injection Inject 0.3 mg into the muscle as needed for anaphylaxis. (Patient not taking: Reported on 04/10/2024) 2 each PRN   fexofenadine  (ALLEGRA ) 180 MG tablet Take 1 tablet (180 mg total) by mouth daily. (Patient not taking: Reported on 04/10/2024) 90 tablet 3   meloxicam  (MOBIC ) 15 MG tablet Take 1 tablet (15 mg total) by mouth daily. Take as needed for shoulder pain (Patient not taking: Reported on  04/10/2024) 30 tablet 0   predniSONE  (DELTASONE ) 20 MG tablet Take 2 a day for 5 days then 1 a day for 5 days then discontinue (Patient not taking: Reported on 04/10/2024) 15 tablet 0   [DISCONTINUED] metoprolol  tartrate (LOPRESSOR ) 100 MG tablet Take 1 tablet (100 mg total) by mouth once for 1 dose. Take 2 hours prior to your CT if your heart rate is greater than 55 1 tablet 0   No current facility-administered medications on file prior to visit.      Allergies:   Allergies  Allergen Reactions   No Known Allergies      Physical Exam:   Vitals:   05/02/24 0959  BP: (!) 102/59  Pulse: 90   Sitting comfortably on the sonogram table Nonlabored breathing Normal rate and rhythm Abdomen is nontender  Thank you for the opportunity to be involved with this patient's care. Please let us  know if we can be of any further assistance.   30 minutes of time was spent reviewing the patient's chart including labs, imaging and documentation.  At least 50% of this time was spent with direct patient care discussing the diagnosis, management and prognosis of her care.  Delora Smaller MFM, [facility]   05/02/2024  11:23 AM

## 2024-05-11 ENCOUNTER — Ambulatory Visit (INDEPENDENT_AMBULATORY_CARE_PROVIDER_SITE_OTHER): Admitting: Family Medicine

## 2024-05-11 VITALS — BP 106/63 | HR 87 | Wt 128.0 lb

## 2024-05-11 DIAGNOSIS — Z9889 Other specified postprocedural states: Secondary | ICD-10-CM

## 2024-05-11 DIAGNOSIS — R87612 Low grade squamous intraepithelial lesion on cytologic smear of cervix (LGSIL): Secondary | ICD-10-CM | POA: Diagnosis not present

## 2024-05-11 DIAGNOSIS — Z3400 Encounter for supervision of normal first pregnancy, unspecified trimester: Secondary | ICD-10-CM | POA: Diagnosis not present

## 2024-05-11 DIAGNOSIS — Z3A24 24 weeks gestation of pregnancy: Secondary | ICD-10-CM

## 2024-05-11 NOTE — Progress Notes (Signed)
   PRENATAL VISIT NOTE  Subjective:  Jessica Potter is a 28 y.o. G1P0000 at [redacted]w[redacted]d being seen today for ongoing prenatal care.  She is currently monitored for the following issues for this low-risk pregnancy and has Eczema; Oral allergy syndrome, subsequent encounter; Seasonal and perennial allergic rhinoconjunctivitis; Supervision of normal first pregnancy, antepartum; and H/O LEEP on their problem list.  Patient reports no complaints.  Contractions: Not present. Vag. Bleeding: None.  Movement: Present. Denies leaking of fluid.   The following portions of the patient's history were reviewed and updated as appropriate: allergies, current medications, past family history, past medical history, past social history, past surgical history and problem list.   Objective:    Vitals:   05/11/24 1410  BP: 106/63  Pulse: 87  Weight: 128 lb (58.1 kg)    Fetal Status:  Fetal Heart Rate (bpm): 141   Movement: Present    General: Alert, oriented and cooperative. Patient is in no acute distress.  Skin: Skin is warm and dry. No rash noted.   Cardiovascular: Normal heart rate noted  Respiratory: Normal respiratory effort, no problems with respiration noted  Abdomen: Soft, gravid, appropriate for gestational age.  Pain/Pressure: Absent     Pelvic: Cervical exam deferred        Extremities: Normal range of motion.  Edema: None  Mental Status: Normal mood and affect. Normal behavior. Normal judgment and thought content.   Assessment and Plan:  Pregnancy: G1P0000 at [redacted]w[redacted]d 1. [redacted] weeks gestation of pregnancy (Primary)  2. Supervision of normal first pregnancy, antepartum FHT normal  3. H/O LEEP Normal CL  4. LGSIL on Pap smear of cervix Had LEEP 2023 that showed CIN1 (colpo was CIN3).  Patient deferred colposcopy until after pregnancy.  Preterm labor symptoms and general obstetric precautions including but not limited to vaginal bleeding, contractions, leaking of fluid and fetal movement were  reviewed in detail with the patient. Please refer to After Visit Summary for other counseling recommendations.   No follow-ups on file.  Future Appointments  Date Time Provider Department Center  06/05/2024  8:35 AM Eveline Lynwood MATSU, MD [facility] None  06/22/2024  1:50 PM Timmy Bubeck J, DO [facility] None  07/06/2024  2:10 PM Banjamin Stovall J, DO [facility] None    Olia Hinderliter J Kenyah Luba, DO

## 2024-06-05 ENCOUNTER — Encounter: Admitting: Obstetrics & Gynecology

## 2024-06-22 ENCOUNTER — Encounter: Admitting: Family Medicine

## 2024-07-06 ENCOUNTER — Encounter: Admitting: Family Medicine
